# Patient Record
Sex: Female | Born: 1945 | Race: Black or African American | Hispanic: No | Smoking: Current every day smoker
Health system: Southern US, Community
[De-identification: ages and names within clinical notes are randomized; demographics above are authoritative.]

## PROBLEM LIST (undated history)

## (undated) DIAGNOSIS — M1990 Unspecified osteoarthritis, unspecified site: Secondary | ICD-10-CM

## (undated) DIAGNOSIS — I4892 Unspecified atrial flutter: Secondary | ICD-10-CM

## (undated) DIAGNOSIS — E785 Hyperlipidemia, unspecified: Secondary | ICD-10-CM

## (undated) DIAGNOSIS — Z87891 Personal history of nicotine dependence: Secondary | ICD-10-CM

## (undated) DIAGNOSIS — I4891 Unspecified atrial fibrillation: Secondary | ICD-10-CM

## (undated) DIAGNOSIS — E119 Type 2 diabetes mellitus without complications: Secondary | ICD-10-CM

## (undated) DIAGNOSIS — K219 Gastro-esophageal reflux disease without esophagitis: Secondary | ICD-10-CM

## (undated) DIAGNOSIS — I429 Cardiomyopathy, unspecified: Secondary | ICD-10-CM

## (undated) DIAGNOSIS — I509 Heart failure, unspecified: Secondary | ICD-10-CM

## (undated) DIAGNOSIS — E669 Obesity, unspecified: Secondary | ICD-10-CM

## (undated) DIAGNOSIS — I10 Essential (primary) hypertension: Secondary | ICD-10-CM

## (undated) DIAGNOSIS — E538 Deficiency of other specified B group vitamins: Secondary | ICD-10-CM

## (undated) DIAGNOSIS — N6019 Diffuse cystic mastopathy of unspecified breast: Secondary | ICD-10-CM

## (undated) HISTORY — DX: Personal history of nicotine dependence: Z87.891

## (undated) HISTORY — PX: ABDOMINAL HYSTERECTOMY: SHX81

## (undated) HISTORY — PX: BREAST BIOPSY: SHX20

## (undated) HISTORY — PX: CARDIOVERSION: SHX1299

## (undated) HISTORY — DX: Type 2 diabetes mellitus without complications: E11.9

## (undated) HISTORY — DX: Morbid (severe) obesity due to excess calories: E66.01

## (undated) HISTORY — DX: Unspecified atrial flutter: I48.92

## (undated) HISTORY — DX: Cardiomyopathy, unspecified: I42.9

## (undated) HISTORY — PX: CYSTOSCOPY: SUR368

## (undated) HISTORY — DX: Essential (primary) hypertension: I10

---

## 2005-01-11 ENCOUNTER — Emergency Department: Payer: Self-pay | Admitting: Emergency Medicine

## 2007-03-23 ENCOUNTER — Ambulatory Visit: Payer: Self-pay | Admitting: Internal Medicine

## 2008-09-08 ENCOUNTER — Ambulatory Visit: Payer: Self-pay | Admitting: Internal Medicine

## 2008-09-08 ENCOUNTER — Emergency Department: Payer: Self-pay | Admitting: Emergency Medicine

## 2011-01-13 ENCOUNTER — Ambulatory Visit: Payer: Self-pay | Admitting: Obstetrics and Gynecology

## 2014-06-20 ENCOUNTER — Ambulatory Visit: Payer: Self-pay | Admitting: Family Medicine

## 2014-10-09 ENCOUNTER — Ambulatory Visit: Payer: Self-pay | Admitting: Physician Assistant

## 2015-04-21 ENCOUNTER — Inpatient Hospital Stay
Admission: EM | Admit: 2015-04-21 | Discharge: 2015-04-26 | DRG: 308 | Disposition: A | Discharge status: Home / Self Care | Payer: Medicare Other | Attending: Internal Medicine | Admitting: Internal Medicine

## 2015-04-21 ENCOUNTER — Encounter: Payer: Self-pay | Admitting: Emergency Medicine

## 2015-04-21 ENCOUNTER — Emergency Department: Payer: Medicare Other

## 2015-04-21 DIAGNOSIS — Z833 Family history of diabetes mellitus: Secondary | ICD-10-CM

## 2015-04-21 DIAGNOSIS — E119 Type 2 diabetes mellitus without complications: Secondary | ICD-10-CM | POA: Diagnosis present

## 2015-04-21 DIAGNOSIS — I5031 Acute diastolic (congestive) heart failure: Secondary | ICD-10-CM | POA: Diagnosis not present

## 2015-04-21 DIAGNOSIS — Z01818 Encounter for other preprocedural examination: Secondary | ICD-10-CM | POA: Diagnosis not present

## 2015-04-21 DIAGNOSIS — I4892 Unspecified atrial flutter: Secondary | ICD-10-CM

## 2015-04-21 DIAGNOSIS — I429 Cardiomyopathy, unspecified: Secondary | ICD-10-CM | POA: Diagnosis present

## 2015-04-21 DIAGNOSIS — K219 Gastro-esophageal reflux disease without esophagitis: Secondary | ICD-10-CM | POA: Diagnosis present

## 2015-04-21 DIAGNOSIS — I4891 Unspecified atrial fibrillation: Secondary | ICD-10-CM | POA: Diagnosis present

## 2015-04-21 DIAGNOSIS — R0600 Dyspnea, unspecified: Secondary | ICD-10-CM

## 2015-04-21 DIAGNOSIS — Z6841 Body Mass Index (BMI) 40.0 and over, adult: Secondary | ICD-10-CM

## 2015-04-21 DIAGNOSIS — R600 Localized edema: Secondary | ICD-10-CM | POA: Diagnosis present

## 2015-04-21 DIAGNOSIS — I5041 Acute combined systolic (congestive) and diastolic (congestive) heart failure: Secondary | ICD-10-CM | POA: Insufficient documentation

## 2015-04-21 DIAGNOSIS — Z8249 Family history of ischemic heart disease and other diseases of the circulatory system: Secondary | ICD-10-CM

## 2015-04-21 DIAGNOSIS — F17210 Nicotine dependence, cigarettes, uncomplicated: Secondary | ICD-10-CM | POA: Diagnosis present

## 2015-04-21 DIAGNOSIS — E876 Hypokalemia: Secondary | ICD-10-CM | POA: Diagnosis present

## 2015-04-21 DIAGNOSIS — N6019 Diffuse cystic mastopathy of unspecified breast: Secondary | ICD-10-CM | POA: Diagnosis present

## 2015-04-21 DIAGNOSIS — E785 Hyperlipidemia, unspecified: Secondary | ICD-10-CM | POA: Diagnosis present

## 2015-04-21 DIAGNOSIS — R0601 Orthopnea: Secondary | ICD-10-CM | POA: Diagnosis not present

## 2015-04-21 DIAGNOSIS — I10 Essential (primary) hypertension: Secondary | ICD-10-CM | POA: Diagnosis present

## 2015-04-21 DIAGNOSIS — R609 Edema, unspecified: Secondary | ICD-10-CM

## 2015-04-21 DIAGNOSIS — R0602 Shortness of breath: Secondary | ICD-10-CM | POA: Diagnosis not present

## 2015-04-21 DIAGNOSIS — I5043 Acute on chronic combined systolic (congestive) and diastolic (congestive) heart failure: Secondary | ICD-10-CM | POA: Diagnosis not present

## 2015-04-21 DIAGNOSIS — I340 Nonrheumatic mitral (valve) insufficiency: Secondary | ICD-10-CM | POA: Diagnosis not present

## 2015-04-21 DIAGNOSIS — I483 Typical atrial flutter: Secondary | ICD-10-CM | POA: Diagnosis not present

## 2015-04-21 HISTORY — DX: Gastro-esophageal reflux disease without esophagitis: K21.9

## 2015-04-21 HISTORY — DX: Diffuse cystic mastopathy of unspecified breast: N60.19

## 2015-04-21 HISTORY — DX: Obesity, unspecified: E66.9

## 2015-04-21 HISTORY — DX: Hyperlipidemia, unspecified: E78.5

## 2015-04-21 LAB — CBC
HCT: 40.6 % (ref 35.0–47.0)
Hemoglobin: 13.3 g/dL (ref 12.0–16.0)
MCH: 30 pg (ref 26.0–34.0)
MCHC: 32.8 g/dL (ref 32.0–36.0)
MCV: 91.5 fL (ref 80.0–100.0)
PLATELETS: 235 10*3/uL (ref 150–440)
RBC: 4.44 MIL/uL (ref 3.80–5.20)
RDW: 14.9 % — AB (ref 11.5–14.5)
WBC: 9.5 10*3/uL (ref 3.6–11.0)

## 2015-04-21 LAB — TSH: TSH: 0.876 u[IU]/mL (ref 0.350–4.500)

## 2015-04-21 LAB — COMPREHENSIVE METABOLIC PANEL
ALT: 28 U/L (ref 14–54)
ANION GAP: 12 (ref 5–15)
AST: 31 U/L (ref 15–41)
Albumin: 4.2 g/dL (ref 3.5–5.0)
Alkaline Phosphatase: 71 U/L (ref 38–126)
BILIRUBIN TOTAL: 0.5 mg/dL (ref 0.3–1.2)
BUN: 14 mg/dL (ref 6–20)
CHLORIDE: 103 mmol/L (ref 101–111)
CO2: 26 mmol/L (ref 22–32)
Calcium: 9.1 mg/dL (ref 8.9–10.3)
Creatinine, Ser: 1.12 mg/dL — ABNORMAL HIGH (ref 0.44–1.00)
GFR, EST AFRICAN AMERICAN: 58 mL/min — AB (ref >60)
GFR, EST NON AFRICAN AMERICAN: 50 mL/min — AB (ref >60)
Glucose, Bld: 114 mg/dL — ABNORMAL HIGH (ref 65–99)
POTASSIUM: 3.2 mmol/L — AB (ref 3.5–5.1)
Sodium: 141 mmol/L (ref 135–145)
TOTAL PROTEIN: 7.3 g/dL (ref 6.5–8.1)

## 2015-04-21 LAB — PROTIME-INR
INR: 1.02
PROTHROMBIN TIME: 13.6 s (ref 11.4–15.0)

## 2015-04-21 LAB — GLUCOSE, CAPILLARY
GLUCOSE-CAPILLARY: 155 mg/dL — AB (ref 65–99)
Glucose-Capillary: 119 mg/dL — ABNORMAL HIGH (ref 65–99)

## 2015-04-21 LAB — BRAIN NATRIURETIC PEPTIDE: B NATRIURETIC PEPTIDE 5: 96 pg/mL (ref 0.0–100.0)

## 2015-04-21 LAB — TROPONIN I

## 2015-04-21 LAB — APTT: APTT: 27 s (ref 24–36)

## 2015-04-21 MED ORDER — DILTIAZEM HCL 30 MG PO TABS
30.0000 mg | ORAL_TABLET | Freq: Once | ORAL | Status: AC
  Administered 2015-04-21: 30 mg via ORAL
  Filled 2015-04-21: qty 1

## 2015-04-21 MED ORDER — DILTIAZEM HCL 25 MG/5ML IV SOLN
INTRAVENOUS | Status: AC
  Filled 2015-04-21: qty 5

## 2015-04-21 MED ORDER — SODIUM CHLORIDE 0.9 % IV SOLN: 250.0000 mL | INTRAVENOUS | Status: DC | PRN

## 2015-04-21 MED ORDER — DILTIAZEM HCL 100 MG IV SOLR
5.0000 mg/h | Freq: Once | INTRAVENOUS | Status: AC
  Administered 2015-04-21: 5 mg/h via INTRAVENOUS
  Filled 2015-04-21: qty 100

## 2015-04-21 MED ORDER — PRAVASTATIN SODIUM 10 MG PO TABS
10.0000 mg | ORAL_TABLET | Freq: Every day | ORAL | Status: DC
  Administered 2015-04-22 – 2015-04-25 (×4): 10 mg via ORAL
  Filled 2015-04-21 (×4): qty 1

## 2015-04-21 MED ORDER — DILTIAZEM HCL 25 MG/5ML IV SOLN
20.0000 mg | Freq: Once | INTRAVENOUS | Status: AC
  Administered 2015-04-21: 20 mg via INTRAVENOUS

## 2015-04-21 MED ORDER — ACETAMINOPHEN 650 MG RE SUPP: 650.0000 mg | Freq: Four times a day (QID) | RECTAL | Status: DC | PRN

## 2015-04-21 MED ORDER — DILTIAZEM HCL 60 MG PO TABS
60.0000 mg | ORAL_TABLET | Freq: Four times a day (QID) | ORAL | Status: DC
  Administered 2015-04-22 – 2015-04-26 (×18): 60 mg via ORAL
  Filled 2015-04-21 (×18): qty 1

## 2015-04-21 MED ORDER — PANTOPRAZOLE SODIUM 40 MG PO TBEC
40.0000 mg | DELAYED_RELEASE_TABLET | Freq: Every day | ORAL | Status: DC
  Administered 2015-04-22 – 2015-04-26 (×4): 40 mg via ORAL
  Filled 2015-04-21 (×4): qty 1

## 2015-04-21 MED ORDER — GABAPENTIN 300 MG PO CAPS
300.0000 mg | ORAL_CAPSULE | Freq: Every day | ORAL | Status: DC
  Administered 2015-04-22 – 2015-04-25 (×5): 300 mg via ORAL
  Filled 2015-04-21 (×5): qty 1

## 2015-04-21 MED ORDER — GLIMEPIRIDE 2 MG PO TABS
2.0000 mg | ORAL_TABLET | Freq: Every day | ORAL | Status: DC
  Administered 2015-04-22 – 2015-04-25 (×4): 2 mg via ORAL
  Filled 2015-04-21 (×5): qty 1

## 2015-04-21 MED ORDER — HYDROCHLOROTHIAZIDE 25 MG PO TABS
25.0000 mg | ORAL_TABLET | Freq: Every day | ORAL | Status: DC
  Administered 2015-04-22: 25 mg via ORAL
  Filled 2015-04-21: qty 1

## 2015-04-21 MED ORDER — SODIUM CHLORIDE 0.9 % IJ SOLN
3.0000 mL | Freq: Two times a day (BID) | INTRAMUSCULAR | Status: DC
  Administered 2015-04-21 – 2015-04-22 (×2): 3 mL via INTRAVENOUS

## 2015-04-21 MED ORDER — HEPARIN (PORCINE) IN NACL 100-0.45 UNIT/ML-% IJ SOLN: 12.0000 [IU]/kg/h | Freq: Once | INTRAMUSCULAR | Status: DC

## 2015-04-21 MED ORDER — SODIUM CHLORIDE 0.9 % IJ SOLN: 3.0000 mL | INTRAMUSCULAR | Status: DC | PRN

## 2015-04-21 MED ORDER — DILTIAZEM HCL 25 MG/5ML IV SOLN
10.0000 mg | Freq: Once | INTRAVENOUS | Status: AC
  Administered 2015-04-21: 10 mg via INTRAVENOUS
  Filled 2015-04-21: qty 5

## 2015-04-21 MED ORDER — ACETAMINOPHEN 325 MG PO TABS: 650.0000 mg | ORAL_TABLET | Freq: Four times a day (QID) | ORAL | Status: DC | PRN

## 2015-04-21 MED ORDER — LISINOPRIL-HYDROCHLOROTHIAZIDE 20-25 MG PO TABS
1.0000 | ORAL_TABLET | Freq: Every day | ORAL | Status: DC
Start: 2015-04-22 — End: 2015-04-21

## 2015-04-21 MED ORDER — LISINOPRIL 20 MG PO TABS
20.0000 mg | ORAL_TABLET | Freq: Every day | ORAL | Status: DC
  Administered 2015-04-22: 20 mg via ORAL
  Filled 2015-04-21: qty 1

## 2015-04-21 MED ORDER — IOHEXOL 350 MG/ML SOLN
75.0000 mL | Freq: Once | INTRAVENOUS | Status: AC | PRN
  Administered 2015-04-21: 75 mL via INTRAVENOUS

## 2015-04-21 MED ORDER — HEPARIN (PORCINE) IN NACL 100-0.45 UNIT/ML-% IJ SOLN
1400.0000 [IU]/h | INTRAMUSCULAR | Status: DC
  Administered 2015-04-21 – 2015-04-24 (×3): 1400 [IU]/h via INTRAVENOUS
  Filled 2015-04-21 (×7): qty 250

## 2015-04-21 MED ORDER — HEPARIN BOLUS VIA INFUSION
4000.0000 [IU] | Freq: Once | INTRAVENOUS | Status: AC
  Administered 2015-04-21: 4000 [IU] via INTRAVENOUS
  Filled 2015-04-21: qty 4000

## 2015-04-21 NOTE — ED Notes (Signed)
Pt here for SOB that has been ongoing for the past week and a half. Has been steadily worsening.

## 2015-04-21 NOTE — ED Notes (Signed)
CBG 136 

## 2015-04-21 NOTE — ED Provider Notes (Addendum)
[facility] Emergency Department Provider Note     Time seen: ----------------------------------------- 5:55 PM on 04/21/2015 -----------------------------------------    I have reviewed the triage vital signs and the nursing notes.   HISTORY  Chief Complaint Shortness of Breath    HPI Paula Goodman is a 69 y.o. female who presents ER for shortness of breath has been going on for last week and half. Patient states his been steadily worsening.Patient states she's just here to get a breathing treatment and then to go home. She was not aware that her heart was racing, denies any fevers, chills, chest pain, nausea vomiting or diarrhea. Patient does state that her dyspnea is worse with laying flat and exertion. Again she has not had this happen before.   No past medical history on file.  There are no active problems to display for this patient.   No past surgical history on file.  Allergies Review of patient's allergies indicates no known allergies.  Social History Social History  Substance Use Topics  . Smoking status: Current Every Day Smoker  . Smokeless tobacco: None  . Alcohol Use: None    Review of Systems Constitutional: Negative for fever. Eyes: Negative for visual changes. ENT: Negative for sore throat. Cardiovascular: Negative for chest pain. Respiratory: Positive for shortness of breath Gastrointestinal: Negative for abdominal pain, vomiting and diarrhea. Genitourinary: Negative for dysuria. Musculoskeletal: Negative for back pain. Skin: Negative for rash. Neurological: Negative for headaches, focal weakness or numbness.  10-point ROS otherwise negative.  ____________________________________________   PHYSICAL EXAM:  VITAL SIGNS: ED Triage Vitals  Enc Vitals Group     BP 04/21/15 1750 98/58 mmHg     Pulse Rate 04/21/15 1747 153     Resp 04/21/15 1747 20     Temp 04/21/15 1747 97.7 F (36.5 C)     Temp src --       SpO2 --      Weight --      Height --      Head Cir --      Peak Flow --      Pain Score --      Pain Loc --      Pain Edu? --      Excl. in [place]? --     Constitutional: Alert and oriented. Mild distress Eyes: Conjunctivae are normal. PERRL. Normal extraocular movements. ENT   Head: Normocephalic and atraumatic.   Nose: No congestion/rhinnorhea.   Mouth/Throat: Mucous membranes are moist.   Neck: No stridor. Cardiovascular: Rapid rate, regular rhythm. Normal and symmetric distal pulses are present in all extremities. No murmurs, rubs, or gallops. Respiratory: Normal respiratory effort with mild tachypnea. Breath sounds are clear and equal bilaterally. No wheezes/rales/rhonchi. Gastrointestinal: Soft and nontender. No distention. No abdominal bruits.  Musculoskeletal: Nontender with normal range of motion in all extremities. No joint effusions.  No lower extremity tenderness, mild edema lower extremities around the ankles Neurologic:  Normal speech and language. No gross focal neurologic deficits are appreciated. Speech is normal. No gait instability. Skin:  Skin is warm, dry and intact. No rash noted. Psychiatric: Mood and affect are normal. Speech and behavior are normal. Patient exhibits appropriate insight and judgment. ____________________________________________  EKG: Interpreted by me. Atrial flutter with a 2-1 conduction, rate is 154 bpm, normal QRS with likely septal infarct age indeterminate, normal axis.  ____________________________________________  ED COURSE:  Pertinent labs & imaging results that were available during my care of the patient were reviewed by  me and considered in my medical decision making (see chart for details). Patient with atrial flutter, new onset. Unclear etiology of her symptoms, she'll receive IV Cardizem. Likely new onset congestive heart failure. ____________________________________________    LABS (pertinent  positives/negatives)  Labs Reviewed  CBC - Abnormal; Notable for the following:    RDW 14.9 (*)    All other components within normal limits  COMPREHENSIVE METABOLIC PANEL - Abnormal; Notable for the following:    Potassium 3.2 (*)    Glucose, Bld 114 (*)    Creatinine, Ser 1.12 (*)    GFR calc non Af Amer 50 (*)    GFR calc Af Amer 58 (*)    All other components within normal limits  TROPONIN I  APTT  PROTIME-INR  BRAIN NATRIURETIC PEPTIDE  TSH    RADIOLOGY Images were viewed by me  Chest x-ray Does not reveal any acute process  IMPRESSION: No acute cardiopulmonary disease and no evidence of pulmonary embolism.  5-6 mm nodule over the lingula. Recommend followup chest CT 6 months. This recommendation follows the consensus statement: Guidelines for Management of Small Pulmonary Nodules Detected on CT Scans: A Statement from the [place] as published in Radiology 2005; 237:395-400. Online at: [email or web address].[email or web address]  1.3 cm precarinal lymph node likely reactive.  Mild cardiomegaly. 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 Recommend annual imaging followup by CTA or MRA. This recommendation follows 2010 ACCF/AHA/AATS/ACR/ASA/SCA/SCAI/SIR/STS/SVM Guidelines for the Diagnosis and Management of Patients with Thoracic Aortic Disease. Circulation.2010; 121: F027-X412.  ____________________________________________  CRITICAL CARE Performed by: Earleen Newport   Total critical care time: 30 minutes  Critical care time was exclusive of separately billable procedures and treating other patients.  Critical care was necessary to treat or prevent imminent or life-threatening deterioration.  Critical care was time spent personally by me on the following activities: development of treatment plan with patient and/or surrogate as well as nursing, discussions with consultants, evaluation of  patient's response to treatment, examination of patient, obtaining history from patient or surrogate, ordering and performing treatments and interventions, ordering and review of laboratory studies, ordering and review of radiographic studies, pulse oximetry and re-evaluation of patient's condition.   FINAL ASSESSMENT AND PLAN  Dyspnea, atrial flutter  Plan: Patient with labs and imaging as dictated above. Patient received several doses of IV Cardizem, or Cardizem for rate control. Patient remains in atrial flutter fib/flutter. Patient will be placed on a Cardizem drip as well as a heparin drip. At this point etiology is unclear.   Earleen Newport, MD   Earleen Newport, MD 04/21/15 1906  Earleen Newport, MD 04/21/15 2031

## 2015-04-21 NOTE — H&P (Signed)
Paula Goodman is an 70 y.o. female.   Chief Complaint: Shortness of breath HPI: Patient presents with a 3 week history of shortness of breath. Mild cough. No chest pain. Shortness of breath worse today. C/O leg swelling with right greater than left. Sitting up seems to make it worse.  No past medical history on file.  HTN GERD DM Hyperlipedemia   No past surgical history on file.  Hysterectomy  No family history on file.  Positive for DM  Social History:  reports that she has been smoking.  She does not have any smokeless tobacco history on file. Her alcohol and drug histories are not on file.  Allergies: No Known Allergies   (Not in a hospital admission)  Results for orders placed or performed during the hospital encounter of 04/21/15 (from the past 48 hour(s))  Brain natriuretic peptide     Status: None   Collection Time: 04/21/15  6:02 PM  Result Value Ref Range   B Natriuretic Peptide 96.0 0.0 - 100.0 pg/mL  CBC     Status: Abnormal   Collection Time: 04/21/15  6:04 PM  Result Value Ref Range   WBC 9.5 3.6 - 11.0 K/uL   RBC 4.44 3.80 - 5.20 MIL/uL   Hemoglobin 13.3 12.0 - 16.0 g/dL   HCT 40.6 35.0 - 47.0 %   MCV 91.5 80.0 - 100.0 fL   MCH 30.0 26.0 - 34.0 pg   MCHC 32.8 32.0 - 36.0 g/dL   RDW 14.9 (H) 11.5 - 14.5 %   Platelets 235 150 - 440 K/uL  Troponin I     Status: None   Collection Time: 04/21/15  6:04 PM  Result Value Ref Range   Troponin I <0.03 <0.031 ng/mL    Comment:        NO INDICATION OF MYOCARDIAL INJURY.   APTT     Status: None   Collection Time: 04/21/15  6:04 PM  Result Value Ref Range   aPTT 27 24 - 36 seconds  Protime-INR     Status: None   Collection Time: 04/21/15  6:04 PM  Result Value Ref Range   Prothrombin Time 13.6 11.4 - 15.0 seconds   INR 1.02   Comprehensive metabolic panel     Status: Abnormal   Collection Time: 04/21/15  6:04 PM  Result Value Ref Range   Sodium 141 135 - 145 mmol/L   Potassium 3.2 (L) 3.5 - 5.1  mmol/L   Chloride 103 101 - 111 mmol/L   CO2 26 22 - 32 mmol/L   Glucose, Bld 114 (H) 65 - 99 mg/dL   BUN 14 6 - 20 mg/dL   Creatinine, Ser 1.12 (H) 0.44 - 1.00 mg/dL   Calcium 9.1 8.9 - 10.3 mg/dL   Total Protein 7.3 6.5 - 8.1 g/dL   Albumin 4.2 3.5 - 5.0 g/dL   AST 31 15 - 41 U/L   ALT 28 14 - 54 U/L   Alkaline Phosphatase 71 38 - 126 U/L   Total Bilirubin 0.5 0.3 - 1.2 mg/dL   GFR calc non Af Amer 50 (L) >60 mL/min   GFR calc Af Amer 58 (L) >60 mL/min    Comment: (NOTE) The eGFR has been calculated using the CKD EPI equation. This calculation has not been validated in all clinical situations. eGFR's persistently <60 mL/min signify possible Chronic Kidney Disease.    Anion gap 12 5 - 15  TSH     Status: None   Collection  Time: 04/21/15  6:04 PM  Result Value Ref Range   TSH 0.876 0.350 - 4.500 uIU/mL   Dg Chest Port 1 View  04/21/2015   CLINICAL DATA:  Worsening shortness of breath since yesterday.  EXAM: PORTABLE CHEST - 1 VIEW  COMPARISON:  09/08/2008  FINDINGS: Lungs are adequately inflated with minimal prominence of the perihilar markings. No evidence of effusion or focal consolidation. Cardiomediastinal silhouette and remainder of the exam is within normal.  IMPRESSION: Evidence of mild vascular congestion.   Electronically Signed   By: Marin Olp M.D.   On: 04/21/2015 18:14    Review of Systems  Constitutional: Negative for fever and chills.  HENT: Negative for hearing loss.   Eyes: Negative for blurred vision.  Respiratory: Positive for shortness of breath.   Cardiovascular: Positive for palpitations. Negative for chest pain.  Gastrointestinal: Negative for nausea, vomiting and diarrhea.  Genitourinary: Negative for dysuria.  Musculoskeletal: Negative for joint pain.  Skin: Negative for rash.  Neurological: Negative for dizziness, sensory change and weakness.    Blood pressure 103/87, pulse 150, temperature 97.7 F (36.5 C), resp. rate 22, SpO2 97 %. Physical  Exam  Constitutional: She is oriented to person, place, and time. She appears well-developed and well-nourished.  Mild resp distress.  HENT:  Head: Normocephalic.  Mouth/Throat: Oropharynx is clear and moist. No oropharyngeal exudate.  Eyes: EOM are normal. Pupils are equal, round, and reactive to light. No scleral icterus.  Neck: Neck supple. No JVD present. No tracheal deviation present. No thyromegaly present.  Cardiovascular:  No murmur heard. Irregular heart beat  Respiratory: She has no wheezes. She exhibits no tenderness.  Clear to ascultation. Mild use of accessary muscles  GI: Soft. Bowel sounds are normal. She exhibits no mass. There is no tenderness.  No organomegaly  Musculoskeletal:  Right leg more edematous than left.  No calf tenderness.  Lymphadenopathy:    She has no cervical adenopathy.  Neurological: She is alert and oriented to person, place, and time. No cranial nerve deficit.  Skin: Skin is warm and dry. No rash noted. No erythema.     Assessment/Plan 1. Atrial Fibrilation with RVR: Patient HR in 140's. Given 2 IV doses of cardizem. Rate has slowed to about 100. Will try to convert to PO cardizem. Will rule out PE as cause with CT. Echo also ordered. Have started anticoagulation.  2. Shortness of Breath: may be due to atrial fib. Ruling out PE as cause. Patient is a smoker and may be developing COPD. No wheezing on exam so will not start nebs at this time.  3. HTN: Tolerating cardizem. Continue other home meds.  4. DM: Contorlled with home meds.  Reviewed past medical records. Discussed case with Dr. Jimmye Norman.  Time spent = 33min    Baxter Hire 04/21/2015, 7:37 PM

## 2015-04-21 NOTE — Progress Notes (Addendum)
ANTICOAGULATION CONSULT NOTE - Initial Consult  Pharmacy Consult for Heparin Indication: atrial fibrillation  No Known Allergies  Patient Measurements: Height: 5\' 7"  (170.2 cm) Weight: 287 lb 6.4 oz (130.364 kg) IBW/kg (Calculated) : 61.6 Heparin Dosing Weight: 93 kg  Vital Signs: Temp: 97.7 F (36.5 C) (08/28 1747) BP: 103/87 mmHg (08/28 1900) Pulse Rate: 150 (08/28 1900)  Labs:  Recent Labs  04/21/15 1804  HGB 13.3  HCT 40.6  PLT 235  APTT 27  LABPROT 13.6  INR 1.02  CREATININE 1.12*  TROPONINI <0.03    Estimated Creatinine Clearance: 68.6 mL/min (by C-G formula based on Cr of 1.12).   Medical History: Past Medical History  Diagnosis Date  . Diabetes mellitus without complication   . Hypertension     Medications:   (Not in a hospital admission)  Assessment: Afib,  No prior anticoagulation noted CrCl = 68.6 ml/mn  Goal of Therapy:  Heparin level 0.3-0.7 units/ml Monitor platelets by anticoagulation protocol: Yes   Plan:  Heparin 4000 units IV X 1 ordered. Heparin gtt ordered to start @ 1400 units/hr. Will draw 1st HL 6 hrs after start of drip on 8/29 @ 2:00.  Paula Goodman D 04/21/2015,8:11 PM

## 2015-04-22 ENCOUNTER — Inpatient Hospital Stay (HOSPITAL_COMMUNITY)
Admit: 2015-04-22 | Discharge: 2015-04-22 | Disposition: A | Discharge status: Home / Self Care | Payer: Medicare Other | Attending: Internal Medicine | Admitting: Internal Medicine

## 2015-04-22 DIAGNOSIS — I340 Nonrheumatic mitral (valve) insufficiency: Secondary | ICD-10-CM

## 2015-04-22 LAB — CBC
HCT: 37.6 % (ref 35.0–47.0)
Hemoglobin: 12.4 g/dL (ref 12.0–16.0)
MCH: 30.1 pg (ref 26.0–34.0)
MCHC: 32.9 g/dL (ref 32.0–36.0)
MCV: 91.4 fL (ref 80.0–100.0)
PLATELETS: 200 10*3/uL (ref 150–440)
RBC: 4.11 MIL/uL (ref 3.80–5.20)
RDW: 14.9 % — AB (ref 11.5–14.5)
WBC: 7.7 10*3/uL (ref 3.6–11.0)

## 2015-04-22 LAB — BASIC METABOLIC PANEL
ANION GAP: 7 (ref 5–15)
Anion gap: 8 (ref 5–15)
BUN: 11 mg/dL (ref 6–20)
BUN: 10 mg/dL (ref 6–20)
CALCIUM: 8.8 mg/dL — AB (ref 8.9–10.3)
CHLORIDE: 104 mmol/L (ref 101–111)
CO2: 29 mmol/L (ref 22–32)
CO2: 28 mmol/L (ref 22–32)
Calcium: 9 mg/dL (ref 8.9–10.3)
Chloride: 106 mmol/L (ref 101–111)
Creatinine, Ser: 0.93 mg/dL (ref 0.44–1.00)
Creatinine, Ser: 0.9 mg/dL (ref 0.44–1.00)
GFR calc Af Amer: >60 mL/min (ref >60)
GFR calc Af Amer: >60 mL/min (ref >60)
GFR calc non Af Amer: >60 mL/min (ref >60)
GLUCOSE: 114 mg/dL — AB (ref 65–99)
GLUCOSE: 172 mg/dL — AB (ref 65–99)
POTASSIUM: 3.3 mmol/L — AB (ref 3.5–5.1)
Potassium: 3 mmol/L — ABNORMAL LOW (ref 3.5–5.1)
SODIUM: 142 mmol/L (ref 135–145)
Sodium: 140 mmol/L (ref 135–145)

## 2015-04-22 LAB — HEPARIN LEVEL (UNFRACTIONATED)
HEPARIN UNFRACTIONATED: 0.42 [IU]/mL (ref 0.30–0.70)
Heparin Unfractionated: 0.42 IU/mL (ref 0.30–0.70)

## 2015-04-22 LAB — GLUCOSE, CAPILLARY
GLUCOSE-CAPILLARY: 108 mg/dL — AB (ref 65–99)
Glucose-Capillary: 188 mg/dL — ABNORMAL HIGH (ref 65–99)
Glucose-Capillary: 109 mg/dL — ABNORMAL HIGH (ref 65–99)
Glucose-Capillary: 151 mg/dL — ABNORMAL HIGH (ref 65–99)

## 2015-04-22 LAB — TSH: TSH: 0.807 u[IU]/mL (ref 0.350–4.500)

## 2015-04-22 LAB — MRSA PCR SCREENING: MRSA by PCR: NEGATIVE

## 2015-04-22 LAB — TROPONIN I: Troponin I: <0.03 ng/mL (ref <0.031)

## 2015-04-22 MED ORDER — DIGOXIN 250 MCG PO TABS
0.2500 mg | ORAL_TABLET | Freq: Every day | ORAL | Status: DC
  Administered 2015-04-22 – 2015-04-26 (×5): 0.25 mg via ORAL
  Filled 2015-04-22 (×3): qty 1
  Filled 2015-04-22: qty 2
  Filled 2015-04-22: qty 1

## 2015-04-22 MED ORDER — POTASSIUM CHLORIDE CRYS ER 20 MEQ PO TBCR
40.0000 meq | EXTENDED_RELEASE_TABLET | Freq: Two times a day (BID) | ORAL | Status: DC
  Administered 2015-04-22 – 2015-04-26 (×8): 40 meq via ORAL
  Filled 2015-04-22 (×8): qty 2

## 2015-04-22 MED ORDER — POTASSIUM CHLORIDE 10 MEQ/100ML IV SOLN
10.0000 meq | INTRAVENOUS | Status: AC
  Administered 2015-04-22 (×4): 10 meq via INTRAVENOUS
  Filled 2015-04-22 (×4): qty 100

## 2015-04-22 NOTE — Progress Notes (Addendum)
Patient received from CCU in no distress, medications given, heparin drip infusing without difficulty, no pain, Aflutter 70's on tele, VSS, call bell in reach, family at bedside, will continue to assess, patient ambulated around nurses station without difficulty

## 2015-04-22 NOTE — Progress Notes (Signed)
ANTICOAGULATION CONSULT NOTE - Initial Consult  Pharmacy Consult for Heparin Indication: atrial fibrillation  No Known Allergies  Patient Measurements: Height: 5\' 7"  (170.2 cm) Weight: 287 lb 6.4 oz (130.364 kg) IBW/kg (Calculated) : 61.6 Heparin Dosing Weight: 93 kg  Vital Signs: Temp: 98 F (36.7 C) (08/29 0200) Temp Source: Oral (08/29 0200) BP: 99/64 mmHg (08/29 0300) Pulse Rate: 73 (08/29 0300)  Labs:  Recent Labs  04/21/15 1804 04/21/15 2352 04/22/15 0207  HGB 13.3  --   --   HCT 40.6  --   --   PLT 235  --   --   APTT 27  --   --   LABPROT 13.6  --   --   INR 1.02  --   --   HEPARINUNFRC  --   --  0.42  CREATININE 1.12*  --   --   TROPONINI <0.03 <0.03  --     Estimated Creatinine Clearance: 68.6 mL/min (by C-G formula based on Cr of 1.12).   Medical History: Past Medical History  Diagnosis Date  . Diabetes mellitus without complication   . Hypertension     Medications:  Prescriptions prior to admission  Medication Sig Dispense Refill Last Dose  . albuterol (PROVENTIL HFA;VENTOLIN HFA) 108 (90 BASE) MCG/ACT inhaler Inhale 2 puffs into the lungs every 6 (six) hours as needed for wheezing or shortness of breath.   04/18/2015  . colchicine 0.6 MG tablet Take 0.6 mg by mouth daily as needed. For gout flare up.   Past Month at Unknown time  . gabapentin (NEURONTIN) 300 MG capsule Take 300 mg by mouth at bedtime.   04/20/2015 at Unknown time  . glimepiride (AMARYL) 2 MG tablet Take 2 mg by mouth daily.  1 04/21/2015 at Unknown time  . glucose (SUNMARK GLUCOSE) 4 GM chewable tablet Chew 8 g by mouth daily as needed. For low blood sugar.   prn  . ketoconazole (NIZORAL) 2 % cream Apply 1 application topically 2 (two) times daily as needed. For rash   04/18/2015  . lisinopril-hydrochlorothiazide (PRINZIDE,ZESTORETIC) 20-25 MG per tablet Take 1 tablet by mouth daily.  1 04/21/2015 at Unknown time  . lovastatin (MEVACOR) 40 MG tablet Take 40 mg by mouth every evening.  With dinner.  1 04/20/2015 at Unknown time  . metFORMIN (GLUCOPHAGE) 1000 MG tablet Take 1,000 mg by mouth 2 (two) times daily with a meal.  4 04/21/2015 at Unknown time  . omeprazole (PRILOSEC) 20 MG capsule Take 20 mg by mouth daily.  1 04/21/2015 at Unknown time    Assessment: Afib,  No prior anticoagulation noted CrCl = 68.6 ml/mn  Goal of Therapy:  Heparin level 0.3-0.7 units/ml Monitor platelets by anticoagulation protocol: Yes   Plan:  Heparin 4000 units IV X 1 ordered. Heparin gtt ordered to start @ 1400 units/hr. Will draw 1st HL 6 hrs after start of drip on 8/29 @ 2:00.  8/29 02:00 anti-xa 0.42. Recheck in 6 hours to confirm with CBC.  Darrin Koman S 04/22/2015,3:22 AM

## 2015-04-22 NOTE — Care Management Note (Signed)
Case Management Note  Patient Details  Name: Paula Goodman MRN: [id number] Date of Birth: Jun 07, 1946  Subjective/Objective:   New onset Afib with RVR.                  Action/Plan: Met with patient to discuss discharge planning. Patient lives at home and her daughter lives with her. She is independent, active and drives. Current with PCP, Dr. Inda Merlin at [facility], [facility]. She uses no assistive devices and requires no home O2. Patient is aware that they may place her on new anticoagulants. Will offer coupon if available.  No needs anticipated at this time  Expected Discharge Date:                  Expected Discharge Plan:  Home/Self Care  In-House Referral:     Discharge planning Services     Post Acute Care Choice:    Choice offered to:     DME Arranged:    DME Agency:     HH Arranged:    [place] Agency:     Status of Service:  In process, will continue to follow  Medicare Important Message Given:    Date Medicare IM Given:    Medicare IM give by:    Date Additional Medicare IM Given:    Additional Medicare Important Message give by:     If discussed at [place] of Stay Meetings, dates discussed:    Additional Comments:  Jolly Mango, RN 04/22/2015, 2:25 PM

## 2015-04-22 NOTE — Progress Notes (Signed)
Patient reports she does not feel well and thinks her sugar is elevated because she is sweating. CBG is 188. Patient received amaryl this morning. Will monitor.

## 2015-04-22 NOTE — Progress Notes (Signed)
[facility]                                                                                                                                                                                            Patient Demographics   Paula Goodman, is a 69 y.o. female, DOB - 12/26/1945, KNL:[id number]  Admit date - 04/21/2015   Admitting Physician Baxter Hire, MD  Outpatient Primary MD for the patient is Sherrin Daisy, MD   LOS - 1  Subjective: His breathing is improved, was taken off Cardizem drip heart rate increases with any activity denies any chest pain     Review of Systems:   CONSTITUTIONAL: No documented fever. No fatigue, weakness. No weight gain, no weight loss.  EYES: No blurry or double vision.  ENT: No tinnitus. No postnasal drip. No redness of the oropharynx.  RESPIRATORY: No cough, no wheeze, no hemoptysis. Positive dyspnea.  CARDIOVASCULAR: No chest pain. No orthopnea. Positive palpitations. No syncope.  GASTROINTESTINAL: No nausea, no vomiting or diarrhea. No abdominal pain. No melena or hematochezia.  GENITOURINARY: No dysuria or hematuria.  ENDOCRINE: No polyuria or nocturia. No heat or cold intolerance.  HEMATOLOGY: No anemia. No bruising. No bleeding.  INTEGUMENTARY: No rashes. No lesions.  MUSCULOSKELETAL: No arthritis. No swelling. No gout.  NEUROLOGIC: No numbness, tingling, or ataxia. No seizure-type activity.  PSYCHIATRIC: No anxiety. No insomnia. No ADD.    Vitals:   Filed Vitals:   04/22/15 0700 04/22/15 0800 04/22/15 0900 04/22/15 1100  BP: 109/65 114/76 124/94   Pulse: 72 77 70   Temp: 98 F (36.7 C)   97.6 F (36.4 C)  TempSrc: Oral   Axillary  Resp: 17 26 25    Height:      Weight:      SpO2: 96% 99% 95%     Wt Readings from Last 3 Encounters:  04/21/15 130.364 kg (287 lb 6.4 oz)     Intake/Output Summary (Last 24 hours) at 04/22/15 1237 Last data filed at 04/22/15 1100  Gross per 24 hour   Intake 446.12 ml  Output    350 ml  Net  96.12 ml    Physical Exam:   GENERAL: Pleasant-appearing in no apparent distress.  HEAD, EYES, EARS, NOSE AND THROAT: Atraumatic, normocephalic. Extraocular muscles are intact. Pupils equal and reactive to light. Sclerae anicteric. No conjunctival injection. No oro-pharyngeal erythema.  NECK: Supple. There is no jugular venous distention. No bruits, no lymphadenopathy, no thyromegaly.  HEART: Irregularly irregular rate and rhythm,. No murmurs, no rubs, no clicks.  LUNGS: Clear to auscultation bilaterally. No rales  or rhonchi. No wheezes.  ABDOMEN: Soft, flat, nontender, nondistended. Has good bowel sounds. No hepatosplenomegaly appreciated.  EXTREMITIES: No evidence of any cyanosis, clubbing, or peripheral edema.  +2 pedal and radial pulses bilaterally.  NEUROLOGIC: The patient is alert, awake, and oriented x3 with no focal motor or sensory deficits appreciated bilaterally.  SKIN: Moist and warm with no rashes appreciated.  Psych: Not anxious, depressed LN: No inguinal LN enlargement    Antibiotics   Anti-infectives    None      Medications   Scheduled Meds: . digoxin  0.25 mg Oral Daily  . diltiazem  60 mg Oral 4 times per day  . gabapentin  300 mg Oral QHS  . glimepiride  2 mg Oral Daily  . lisinopril  20 mg Oral Daily   And  . hydrochlorothiazide  25 mg Oral Daily  . pantoprazole  40 mg Oral Daily  . potassium chloride  40 mEq Oral BID  . pravastatin  10 mg Oral q1800  . sodium chloride  3 mL Intravenous Q12H   Continuous Infusions: . heparin 1,400 Units/hr (04/22/15 1140)   PRN Meds:.sodium chloride, acetaminophen **OR** acetaminophen, sodium chloride   Data Review:   Micro Results Recent Results (from the past 240 hour(s))  MRSA PCR Screening     Status: None   Collection Time: 04/21/15 11:10 PM  Result Value Ref Range Status   MRSA by PCR NEGATIVE NEGATIVE Final    Comment:        The GeneXpert MRSA Assay  (FDA approved for NASAL specimens only), is one component of a comprehensive MRSA colonization surveillance program. It is not intended to diagnose MRSA infection nor to guide or monitor treatment for MRSA infections.     Radiology Reports Ct Angio Chest Pe W/cm &/or Wo Cm  04/21/2015   CLINICAL DATA:  Progressive dyspnea 1-2 weeks worse today with tachycardia. Smoker.  EXAM: CT ANGIOGRAPHY CHEST WITH CONTRAST  TECHNIQUE: Multidetector CT imaging of the chest was performed using the standard protocol during bolus administration of intravenous contrast. Multiplanar CT image reconstructions and MIPs were obtained to evaluate the vascular anatomy.  CONTRAST:  39mL OMNIPAQUE IOHEXOL 350 MG/ML SOLN  COMPARISON:  Chest x-ray today.  FINDINGS: Lungs are adequately inflated without focal consolidation or effusion. There is minimal linear atelectasis/scarring over the lingula. There is a 5-6 mm nodular density over the lingula. Airways are within normal.  There is mild cardiomegaly. There is no evidence of pulmonary embolism. There is no significant hilar adenopathy. There is a 1.3 cm precarinal lymph node likely reactive. Ascending thoracic aorta measures 3.7 cm in AP diameter. No axillary adenopathy. Remaining mediastinal structures are unremarkable.  Limited images through the upper abdomen are within normal. There are mild degenerate changes of the spine.  Review of the MIP images confirms the above findings.  IMPRESSION: No acute cardiopulmonary disease and no evidence of pulmonary embolism.  5-6 mm nodule over the lingula. Recommend followup chest CT 6 months. This recommendation follows the consensus statement: Guidelines for Management of Small Pulmonary Nodules Detected on CT Scans: A Statement from the [employer] as published in Radiology 2005; 237:395-400. Online at: https://www.arnold.com/.  1.3 cm precarinal lymph node likely reactive.  Mild cardiomegaly. Mild  ectasia of the ascending thoracic aorta measuring 3.7 cm in AP diameter. Recommend annual imaging followup by CTA or MRA. This recommendation follows 2010 ACCF/AHA/AATS/ACR/ASA/SCA/SCAI/SIR/STS/SVM Guidelines for the Diagnosis and Management of Patients with Thoracic Aortic Disease. Circulation.2010; 121: M578-I696.   Electronically  Signed   By: Marin Olp M.D.   On: 04/21/2015 20:23   Dg Chest Port 1 View  04/21/2015   CLINICAL DATA:  Worsening shortness of breath since yesterday.  EXAM: PORTABLE CHEST - 1 VIEW  COMPARISON:  09/08/2008  FINDINGS: Lungs are adequately inflated with minimal prominence of the perihilar markings. No evidence of effusion or focal consolidation. Cardiomediastinal silhouette and remainder of the exam is within normal.  IMPRESSION: Evidence of mild vascular congestion.   Electronically Signed   By: Marin Olp M.D.   On: 04/21/2015 18:14     CBC  Recent Labs Lab 04/21/15 1804 04/22/15 0813  WBC 9.5 7.7  HGB 13.3 12.4  HCT 40.6 37.6  PLT 235 200  MCV 91.5 91.4  MCH 30.0 30.1  MCHC 32.8 32.9  RDW 14.9* 14.9*    Chemistries   Recent Labs Lab 04/21/15 1804 04/22/15 0544  NA 141 142  K 3.2* 3.0*  CL 103 106  CO2 26 29  GLUCOSE 114* 114*  BUN 14 11  CREATININE 1.12* 0.93  CALCIUM 9.1 8.8*  AST 31  --   ALT 28  --   ALKPHOS 71  --   BILITOT 0.5  --    ------------------------------------------------------------------------------------------------------------------ estimated creatinine clearance is 82.6 mL/min (by C-G formula based on Cr of 0.93). ------------------------------------------------------------------------------------------------------------------ No results for input(s): HGBA1C in the last 72 hours. ------------------------------------------------------------------------------------------------------------------ No results for input(s): CHOL, HDL, LDLCALC, TRIG, CHOLHDL, LDLDIRECT in the last 72  hours. ------------------------------------------------------------------------------------------------------------------  Recent Labs  04/21/15 1804  TSH 0.876   ------------------------------------------------------------------------------------------------------------------ No results for input(s): VITAMINB12, FOLATE, FERRITIN, TIBC, IRON, RETICCTPCT in the last 72 hours.  Coagulation profile  Recent Labs Lab 04/21/15 1804  INR 1.02    No results for input(s): DDIMER in the last 72 hours.  Cardiac Enzymes  Recent Labs Lab 04/21/15 2352 04/22/15 0544 04/22/15 1103  TROPONINI <0.03 <0.03 <0.03   ------------------------------------------------------------------------------------------------------------------ Invalid input(s): POCBNP    Assessment & Plan   1.  Atrial Fibrilation with RVR: Continue oral Cardizem. Heart rate is still poorly controlled I will start on some digoxin due to low blood pressure cardiology evaluation pending. Echocardiogram of the heart patients chads score is high and will require anticoagulation currently on heparin drip.   2. Shortness of Breath: may be due to atrial fibCT scan per PE PE negative for any pulmonary pathology  3. HTNContinue Cardizem and discontinue other blood pressure meds due to blood pressure being low   4. DM: Contorlled with home meds.Sliding scale insulin        Code Status Orders        Start     Ordered   04/21/15 2313  Full code   Continuous     04/21/15 2312           Consults cardiology   DVT Prophylaxis  heparin   Lab Results  Component Value Date   PLT 200 04/22/2015     Time Spent in minutes  45 minutes     Dustin Flock M.D on 04/22/2015 at 12:37 PM  Between 7am to 6pm - Pager - [phone]  After 6pm go to [email or web address] - password [email or web address] [facility]   Office  [phone]

## 2015-04-22 NOTE — Progress Notes (Signed)
ANTICOAGULATION CONSULT NOTE - Initial Consult  Pharmacy Consult for Heparin Indication: atrial fibrillation  No Known Allergies  Patient Measurements: Height: 5\' 7"  (170.2 cm) Weight: 287 lb 6.4 oz (130.364 kg) IBW/kg (Calculated) : 61.6 Heparin Dosing Weight: 93 kg  Vital Signs: Temp: 97.6 F (36.4 C) (08/29 1100) Temp Source: Axillary (08/29 1100) BP: 116/85 mmHg (08/29 1300) Pulse Rate: 52 (08/29 1300)  Labs:  Recent Labs  04/21/15 1804 04/21/15 2352 04/22/15 0207 04/22/15 0544 04/22/15 0813 04/22/15 1103  HGB 13.3  --   --   --  12.4  --   HCT 40.6  --   --   --  37.6  --   PLT 235  --   --   --  200  --   APTT 27  --   --   --   --   --   LABPROT 13.6  --   --   --   --   --   INR 1.02  --   --   --   --   --   HEPARINUNFRC  --   --  0.42  --  0.42  --   CREATININE 1.12*  --   --  0.93  --   --   TROPONINI <0.03 <0.03  --  <0.03  --  <0.03    Estimated Creatinine Clearance: 82.6 mL/min (by C-G formula based on Cr of 0.93).   Medical History: Past Medical History  Diagnosis Date  . Diabetes mellitus without complication   . Hypertension     Medications:  Prescriptions prior to admission  Medication Sig Dispense Refill Last Dose  . albuterol (PROVENTIL HFA;VENTOLIN HFA) 108 (90 BASE) MCG/ACT inhaler Inhale 2 puffs into the lungs every 6 (six) hours as needed for wheezing or shortness of breath.   04/18/2015  . colchicine 0.6 MG tablet Take 0.6 mg by mouth daily as needed. For gout flare up.   Past Month at Unknown time  . gabapentin (NEURONTIN) 300 MG capsule Take 300 mg by mouth at bedtime.   04/20/2015 at Unknown time  . glimepiride (AMARYL) 2 MG tablet Take 2 mg by mouth daily.  1 04/21/2015 at Unknown time  . glucose (SUNMARK GLUCOSE) 4 GM chewable tablet Chew 8 g by mouth daily as needed. For low blood sugar.   prn  . ketoconazole (NIZORAL) 2 % cream Apply 1 application topically 2 (two) times daily as needed. For rash   04/18/2015  .  lisinopril-hydrochlorothiazide (PRINZIDE,ZESTORETIC) 20-25 MG per tablet Take 1 tablet by mouth daily.  1 04/21/2015 at Unknown time  . lovastatin (MEVACOR) 40 MG tablet Take 40 mg by mouth every evening. With dinner.  1 04/20/2015 at Unknown time  . metFORMIN (GLUCOPHAGE) 1000 MG tablet Take 1,000 mg by mouth 2 (two) times daily with a meal.  4 04/21/2015 at Unknown time  . omeprazole (PRILOSEC) 20 MG capsule Take 20 mg by mouth daily.  1 04/21/2015 at Unknown time    Assessment: 69 y/o F with afib on heparin drip and cardizem drip.   Goal of Therapy:  Heparin level 0.3-0.7 units/ml Monitor platelets by anticoagulation protocol: Yes   Plan:  Heparin level is at goal so will continue heparin drip at 1400 units and f/u AM labs.   Ulice Dash D 04/22/2015,1:17 PM

## 2015-04-22 NOTE — Progress Notes (Addendum)
Pt a&o. On room air O2 WNL, pt does become SOB with exertion.  Cardizem drip off at 0300, HR controlled with PO cardizem although pt remains in a-fib. No c/o pain.  MD informed of K of 3.0 this am.

## 2015-04-22 NOTE — Progress Notes (Signed)
*  PRELIMINARY RESULTS* Echocardiogram 2D Echocardiogram has been performed.  Paula Goodman 04/22/2015, 3:19 PM

## 2015-04-23 ENCOUNTER — Encounter: Payer: Self-pay | Admitting: Physician Assistant

## 2015-04-23 ENCOUNTER — Inpatient Hospital Stay: Payer: Medicare Other

## 2015-04-23 DIAGNOSIS — I5031 Acute diastolic (congestive) heart failure: Secondary | ICD-10-CM

## 2015-04-23 DIAGNOSIS — R0602 Shortness of breath: Secondary | ICD-10-CM

## 2015-04-23 DIAGNOSIS — I4891 Unspecified atrial fibrillation: Principal | ICD-10-CM

## 2015-04-23 DIAGNOSIS — E785 Hyperlipidemia, unspecified: Secondary | ICD-10-CM

## 2015-04-23 DIAGNOSIS — Z720 Tobacco use: Secondary | ICD-10-CM

## 2015-04-23 DIAGNOSIS — E876 Hypokalemia: Secondary | ICD-10-CM

## 2015-04-23 LAB — BASIC METABOLIC PANEL
Anion gap: 7 (ref 5–15)
BUN: 9 mg/dL (ref 6–20)
CALCIUM: 9.2 mg/dL (ref 8.9–10.3)
CO2: 28 mmol/L (ref 22–32)
CREATININE: 0.86 mg/dL (ref 0.44–1.00)
Chloride: 105 mmol/L (ref 101–111)
GFR calc non Af Amer: >60 mL/min (ref >60)
Glucose, Bld: 129 mg/dL — ABNORMAL HIGH (ref 65–99)
Potassium: 3.4 mmol/L — ABNORMAL LOW (ref 3.5–5.1)
Sodium: 140 mmol/L (ref 135–145)

## 2015-04-23 LAB — GLUCOSE, CAPILLARY
GLUCOSE-CAPILLARY: 166 mg/dL — AB (ref 65–99)
Glucose-Capillary: 128 mg/dL — ABNORMAL HIGH (ref 65–99)
Glucose-Capillary: 120 mg/dL — ABNORMAL HIGH (ref 65–99)

## 2015-04-23 LAB — CBC
HCT: 38.7 % (ref 35.0–47.0)
Hemoglobin: 12.9 g/dL (ref 12.0–16.0)
MCH: 30.8 pg (ref 26.0–34.0)
MCHC: 33.4 g/dL (ref 32.0–36.0)
MCV: 92.4 fL (ref 80.0–100.0)
PLATELETS: 194 10*3/uL (ref 150–440)
RBC: 4.19 MIL/uL (ref 3.80–5.20)
RDW: 15.1 % — AB (ref 11.5–14.5)
WBC: 8.1 10*3/uL (ref 3.6–11.0)

## 2015-04-23 LAB — HEPARIN LEVEL (UNFRACTIONATED): HEPARIN UNFRACTIONATED: 0.49 [IU]/mL (ref 0.30–0.70)

## 2015-04-23 MED ORDER — DIGOXIN 250 MCG PO TABS: 0.2500 mg | ORAL_TABLET | Freq: Once | ORAL | Status: AC

## 2015-04-23 MED ORDER — METOPROLOL TARTRATE 25 MG PO TABS
25.0000 mg | ORAL_TABLET | Freq: Two times a day (BID) | ORAL | Status: DC
  Administered 2015-04-23: 25 mg via ORAL
  Filled 2015-04-23: qty 1

## 2015-04-23 MED ORDER — DILTIAZEM HCL 25 MG/5ML IV SOLN
10.0000 mg | Freq: Four times a day (QID) | INTRAVENOUS | Status: DC | PRN
  Administered 2015-04-23 – 2015-04-24 (×2): 10 mg via INTRAVENOUS
  Filled 2015-04-23 (×2): qty 5

## 2015-04-23 MED ORDER — METOPROLOL TARTRATE 25 MG PO TABS
25.0000 mg | ORAL_TABLET | Freq: Three times a day (TID) | ORAL | Status: DC
  Administered 2015-04-23 – 2015-04-25 (×7): 25 mg via ORAL
  Filled 2015-04-23 (×7): qty 1

## 2015-04-23 MED ORDER — POTASSIUM CHLORIDE CRYS ER 20 MEQ PO TBCR
40.0000 meq | EXTENDED_RELEASE_TABLET | Freq: Once | ORAL | Status: AC
  Administered 2015-04-23: 40 meq via ORAL
  Filled 2015-04-23: qty 2

## 2015-04-23 NOTE — Care Management Important Message (Signed)
Important Message  Patient Details  Name: Marigene Erler MRN: [id number] Date of Birth: [birth year]   Medicare Important Message Given:  Yes-second notification given    Darius Bump Allmond 04/23/2015, 9:58 AM

## 2015-04-23 NOTE — Consult Note (Signed)
Cardiology Consultation Note  Patient ID: Paula Goodman, MRN: [id number], DOB/AGE: October 09, 1945 69 y.o. Admit date: 04/21/2015   Date of Consult: 04/23/2015 Primary Physician: Sherrin Daisy, MD Primary Cardiologist: New to [facility]  Chief Complaint: SOB Reason for Consult: New onset Afib with RVR with rates into the 140's  HPI: 69 y.o. female with h/o DM2, HTN, HLD, ongoing tobacco abuse, and obesity who presented to [facility] on 8/29 with 2 week history of increasing exertional dyspnea, now at rest as well, and was found to be in new onset Afib with RVR with rates into the 140's.   She has never seen a cardiologist before. No prior ischemic evaluations. She last saw her PCP in early July 2016 for follow up diabetes OV and was still smoking at that time, reporting it difficult to quit. Last 3 office weights at her PCP are as follows: on 12/03/2014: 279 lb, on 12/11/2014: 278 lb, on 02/27/2015: 278 lb. Last A1C on 7/6 was 7.9%, this was improved from 8.3% on 12/11/2014.   She has been experiencing a 2 week history of increasing exertional dyspnea when walking out to the mail box. Over the past 2 weeks she was asymptomatic while at rest. However, over the past 2-3 days she became symptomatic at rest with increased dyspnea prompting her to come in for evaluation. Also with associated palpitations, stating her heart will just be "racing." SOme associated orthopnea. No chest pain, diaphoresis, nausea, vomiting, presyncope, or syncope. She has noted some increased LEE, mostly along the right lower extremity more so than the left lower extremity. No prior history of arrhythmia.   Upon her arrival to [facility] she was found to be in Afib with RVR with heart rates in the 140's. She received IV Cardizem, however her BP was somewhat soft in the 90's/60's which precluded further titration. She was transitioned to PO Cardizem 60 mg qid and metoprolol 25 mg bid. Thus she was started on digoxin 0.25 mg without loading dose. She  was also started on a heparin gtt given her CHADSVASc of at least 4 (HTN, age x 1, diabetes, female), giving her an estimated annual stroke risk of 4.0%. She was continued on lisinopril/HCTZ 20/25 mg. She was found to be hypokalemic with K+ of 3.2-->3.0-->3.3-->3.4. Troponin negative x 4. TSH normal. CBC unremarkable. CXR with mild vascular congestion. CTA chest with no acute cardiopulmonary disease and no evidence of PE. It did show a 5-6 mm nodule over the lingula. Follow imaging was recommended in 6 months. Her heart rate has been well controlled when she is at rest, however with minimal movement, even in the bed, her heart rate will become tachycardic into the 1-teens to 140's. She remains in Afib a this time and is SOB with movement.      Past Medical History  Diagnosis Date  . Diabetes mellitus   . Hypertension   . Obesity   . HLD (hyperlipidemia)   . Tobacco abuse   . GERD (gastroesophageal reflux disease)   . Fibrocystic breast disease       Most Recent Cardiac Studies: Echo from 8/29 is pending   Surgical History: History reviewed. No pertinent past surgical history.   Home Meds: Prior to Admission medications   Medication Sig Start Date End Date Taking? Authorizing Provider  albuterol (PROVENTIL HFA;VENTOLIN HFA) 108 (90 BASE) MCG/ACT inhaler Inhale 2 puffs into the lungs every 6 (six) hours as needed for wheezing or shortness of breath.   Yes Historical Provider, MD  colchicine  0.6 MG tablet Take 0.6 mg by mouth daily as needed. For gout flare up.   Yes Historical Provider, MD  gabapentin (NEURONTIN) 300 MG capsule Take 300 mg by mouth at bedtime.   Yes Historical Provider, MD  glimepiride (AMARYL) 2 MG tablet Take 2 mg by mouth daily. 02/27/15  Yes Historical Provider, MD  glucose (SUNMARK GLUCOSE) 4 GM chewable tablet Chew 8 g by mouth daily as needed. For low blood sugar.   Yes Historical Provider, MD  ketoconazole (NIZORAL) 2 % cream Apply 1 application topically 2 (two) times  daily as needed. For rash 04/02/14  Yes Historical Provider, MD  lisinopril-hydrochlorothiazide (PRINZIDE,ZESTORETIC) 20-25 MG per tablet Take 1 tablet by mouth daily. 02/28/15  Yes Historical Provider, MD  lovastatin (MEVACOR) 40 MG tablet Take 40 mg by mouth every evening. With dinner. 02/27/15  Yes Historical Provider, MD  metFORMIN (GLUCOPHAGE) 1000 MG tablet Take 1,000 mg by mouth 2 (two) times daily with a meal. 03/30/15  Yes Historical Provider, MD  omeprazole (PRILOSEC) 20 MG capsule Take 20 mg by mouth daily. 02/27/15  Yes Historical Provider, MD    Inpatient Medications:  . digoxin  0.25 mg Oral Daily  . diltiazem  60 mg Oral 4 times per day  . gabapentin  300 mg Oral QHS  . glimepiride  2 mg Oral Daily  . metoprolol tartrate  25 mg Oral BID  . pantoprazole  40 mg Oral Daily  . potassium chloride  40 mEq Oral BID  . pravastatin  10 mg Oral q1800  . sodium chloride  3 mL Intravenous Q12H   . heparin 1,400 Units/hr (04/22/15 1140)    Allergies: No Known Allergies  Social History   Social History  . Marital Status: Widowed    Spouse Name: N/A  . Number of Children: N/A  . Years of Education: N/A   Occupational History  . Not on file.   Social History Main Topics  . Smoking status: Current Every Day Smoker  . Smokeless tobacco: Not on file  . Alcohol Use: Not on file  . Drug Use: Not on file  . Sexual Activity: Not on file   Other Topics Concern  . Not on file   Social History Narrative     Family History  Problem Relation Age of Onset  . Hypertension Mother   . Diabetes Mother      Review of Systems: Review of Systems  Constitutional: Positive for malaise/fatigue. Negative for fever, chills, weight loss and diaphoresis.  HENT: Negative for congestion.   Eyes: Negative for discharge and redness.  Respiratory: Positive for cough and shortness of breath. Negative for hemoptysis, sputum production and wheezing.   Cardiovascular: Positive for palpitations, leg  swelling and PND. Negative for chest pain, orthopnea and claudication.       RLE>LLE  Gastrointestinal: Negative for heartburn, nausea, vomiting, abdominal pain, blood in stool and melena.  Genitourinary: Negative for hematuria.  Musculoskeletal: Negative for myalgias and falls.  Skin: Negative for rash.  Neurological: Positive for weakness. Negative for dizziness, sensory change, speech change and focal weakness.  Endo/Heme/Allergies: Does not bruise/bleed easily.  Psychiatric/Behavioral: Positive for substance abuse. The patient is not nervous/anxious.        Ongoing tobacco abuse     Labs:  Recent Labs  04/21/15 1804 04/21/15 2352 04/22/15 0544 04/22/15 1103  TROPONINI <0.03 <0.03 <0.03 <0.03   Lab Results  Component Value Date   WBC 8.1 04/23/2015   HGB 12.9 04/23/2015  HCT 38.7 04/23/2015   MCV 92.4 04/23/2015   PLT 194 04/23/2015    Recent Labs Lab 04/21/15 1804  04/23/15 0558  NA 141  < > 140  K 3.2*  < > 3.4*  CL 103  < > 105  CO2 26  < > 28  BUN 14  < > 9  CREATININE 1.12*  < > 0.86  CALCIUM 9.1  < > 9.2  PROT 7.3  --   --   BILITOT 0.5  --   --   ALKPHOS 71  --   --   ALT 28  --   --   AST 31  --   --   GLUCOSE 114*  < > 129*  < > = values in this interval not displayed. No results found for: CHOL, HDL, LDLCALC, TRIG No results found for: DDIMER  Radiology/Studies:  Ct Angio Chest Pe W/cm &/or Wo Cm  04/21/2015   CLINICAL DATA:  Progressive dyspnea 1-2 weeks worse today with tachycardia. Smoker.  EXAM: CT ANGIOGRAPHY CHEST WITH CONTRAST  TECHNIQUE: Multidetector CT imaging of the chest was performed using the standard protocol during bolus administration of intravenous contrast. Multiplanar CT image reconstructions and MIPs were obtained to evaluate the vascular anatomy.  CONTRAST:  54mL OMNIPAQUE IOHEXOL 350 MG/ML SOLN  COMPARISON:  Chest x-ray today.  FINDINGS: Lungs are adequately inflated without focal consolidation or effusion. There is minimal  linear atelectasis/scarring over the lingula. There is a 5-6 mm nodular density over the lingula. Airways are within normal.  There is mild cardiomegaly. There is no evidence of pulmonary embolism. There is no significant hilar adenopathy. There is a 1.3 cm precarinal lymph node likely reactive. Ascending thoracic aorta measures 3.7 cm in AP diameter. No axillary adenopathy. Remaining mediastinal structures are unremarkable.  Limited images through the upper abdomen are within normal. There are mild degenerate changes of the spine.  Review of the MIP images confirms the above findings.  IMPRESSION: No acute cardiopulmonary disease and no evidence of pulmonary embolism.  5-6 mm nodule over the lingula. Recommend followup chest CT 6 months. This recommendation follows the consensus statement: Guidelines for Management of Small Pulmonary Nodules Detected on CT Scans: A Statement from the [place] as published in Radiology 2005; 237:395-400. Online at: https://www.arnold.com/.  1.3 cm precarinal lymph node likely reactive.  Mild cardiomegaly. 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 Recommend annual imaging followup by CTA or MRA. This recommendation follows 2010 ACCF/AHA/AATS/ACR/ASA/SCA/SCAI/SIR/STS/SVM Guidelines for the Diagnosis and Management of Patients with Thoracic Aortic Disease. Circulation.2010; 121: K998-P382.   Electronically Signed   By: Marin Olp M.D.   On: 04/21/2015 20:23   Dg Chest Port 1 View  04/21/2015   CLINICAL DATA:  Worsening shortness of breath since yesterday.  EXAM: PORTABLE CHEST - 1 VIEW  COMPARISON:  09/08/2008  FINDINGS: Lungs are adequately inflated with minimal prominence of the perihilar markings. No evidence of effusion or focal consolidation. Cardiomediastinal silhouette and remainder of the exam is within normal.  IMPRESSION: Evidence of mild vascular congestion.   Electronically Signed   By:  Marin Olp M.D.   On: 04/21/2015 18:14    EKG: Afib with RVR, 145 bpm, low voltage, poor R wave progression, cannot rule out anterior infarct, inferior st/t changes    Weights: Filed Weights   04/21/15 1951  Weight: 287 lb 6.4 oz (130.364 kg)     Physical Exam: Blood pressure 113/76, pulse 109, temperature  97.7 F (36.5 C), temperature source Oral, resp. rate 19, height 5\' 7"  (1.702 m), weight 287 lb 6.4 oz (130.364 kg), SpO2 94 %. Body mass index is 45 kg/(m^2). General: Well developed, well nourished, in no acute distress. Head: Normocephalic, atraumatic, sclera non-icteric, no xanthomas, nares are without discharge.  Neck: Negative for carotid bruits. JVD not elevated. Lungs: Clear bilaterally to auscultation without wheezes, rales, or rhonchi. Breathing is unlabored. Heart: Irregularly-irregular, tachycardic, with S1 S2. No murmurs, rubs, or gallops appreciated. Abdomen: Obese, soft, non-tender, non-distended with normoactive bowel sounds. No hepatomegaly. No rebound/guarding. No obvious abdominal masses. Msk:  Strength and tone appear normal for age. Extremities: No clubbing or cyanosis. Trace non-pitting edema R>L lower extremity.  Distal pedal pulses are 2+ and equal bilaterally. Neuro: Alert and oriented X 3. No facial asymmetry. No focal deficit. Moves all extremities spontaneously. Psych:  Responds to questions appropriately with a normal affect.    Assessment and Plan:  69 y.o. female with h/o DM2, HTN, HLD, ongoing tobacco abuse, and obesity who presented to [facility] on 8/29 with 2 week history of increasing exertional dyspnea, now at rest as well, and was found to be in new onset Afib with RVR with rates into the 140's.  1. New onset Afib with RVR: -Has been experiencing increased dyspnea over the past 2 weeks, initially with exertion only, now at rest as well -High probably that she was in Afib at that time -Will need to continue rate control as she was not on full dose  anticoagulation upon admission  -Will give a one time digoxin bolus of 0.5 mg PO today, resume 0.25 mg digoxin on 8/31 with digoxin level in the morning  -Increase metoprolol to 25 mg tid with hold parameters  -Continue diltiazem 60 mg q 6 hours -Lisinopril/HCTZ have been held to allow for more room for rate control -Possibly in the setting of hypokalemia   -On heparin gtt at this time, continue pending possible procedures -Plan to change to Eliquis vs Xarelto given her CHADSVASc of 4 (HTN, diabetes, age x 1, female), giving her an estimated annual stroke risk of 4.0%  2. Increased dyspnea: -In the setting of Afib with RVR -Cannot rule out decompensated heart failure  -Likely holding on to excess volume in the setting of her new onset Afib with RVR -Check echo to evaluate LV function, wall motion, and right side pressure   3. Hypokalemia: -Likely in the setting of HCTZ -Replete to 4.0  4. HTN: -Stable, somewhat soft -Continue current medications as above  5. HLD: -Continue pravastatin   6. Ongoing tobacco abuse: -Cessation advised   Melvern Banker, PA-C Pager: [phone] 04/23/2015, 10:40 AM

## 2015-04-23 NOTE — Progress Notes (Signed)
[facility]                                                                                                                                                                                            Patient Demographics   Paula Goodman, is a 69 y.o. female, DOB - 04-Dec-1945, NWG:[id number]  Admit date - 04/21/2015   Admitting Physician Baxter Hire, MD  Outpatient Primary MD for the patient is Sherrin Daisy, MD   LOS - 2  Subjective: contineus to be sob, hr poor control. Echo results pending    Review of Systems:   CONSTITUTIONAL: No documented fever. No fatigue, weakness. No weight gain, no weight loss.  EYES: No blurry or double vision.  ENT: No tinnitus. No postnasal drip. No redness of the oropharynx.  RESPIRATORY: No cough, no wheeze, no hemoptysis. Positive dyspnea.  CARDIOVASCULAR: No chest pain. No orthopnea. Positive palpitations. No syncope.  GASTROINTESTINAL: No nausea, no vomiting or diarrhea. No abdominal pain. No melena or hematochezia.  GENITOURINARY: No dysuria or hematuria.  ENDOCRINE: No polyuria or nocturia. No heat or cold intolerance.  HEMATOLOGY: No anemia. No bruising. No bleeding.  INTEGUMENTARY: No rashes. No lesions.  MUSCULOSKELETAL: No arthritis. No swelling. No gout.  NEUROLOGIC: No numbness, tingling, or ataxia. No seizure-type activity.  PSYCHIATRIC: No anxiety. No insomnia. No ADD.    Vitals:   Filed Vitals:   04/22/15 1400 04/22/15 1705 04/22/15 2047 04/23/15 0442  BP: 116/91 119/77 108/82 113/76  Pulse: 48 89 80 109  Temp:  97.8 F (36.6 C) 98.3 F (36.8 C) 97.7 F (36.5 C)  TempSrc:  Oral    Resp: 32 18 19 19   Height:      Weight:      SpO2: 92% 92% 97% 94%    Wt Readings from Last 3 Encounters:  04/21/15 130.364 kg (287 lb 6.4 oz)     Intake/Output Summary (Last 24 hours) at 04/23/15 1230 Last data filed at 04/23/15 5784  Gross per 24 hour  Intake     28 ml  Output    950  ml  Net   -922 ml    Physical Exam:   GENERAL: Pleasant-appearing in no apparent distress.  HEAD, EYES, EARS, NOSE AND THROAT: Atraumatic, normocephalic. Extraocular muscles are intact. Pupils equal and reactive to light. Sclerae anicteric. No conjunctival injection. No oro-pharyngeal erythema.  NECK: Supple. There is no jugular venous distention. No bruits, no lymphadenopathy, no thyromegaly.  HEART: Irregularly irregular rate and rhythm,. No murmurs, no rubs, no clicks.  LUNGS: Clear to auscultation bilaterally. No rales or rhonchi.  No wheezes.  ABDOMEN: Soft, flat, nontender, nondistended. Has good bowel sounds. No hepatosplenomegaly appreciated.  EXTREMITIES: No evidence of any cyanosis, clubbing, or peripheral edema.  +2 pedal and radial pulses bilaterally.  NEUROLOGIC: The patient is alert, awake, and oriented x3 with no focal motor or sensory deficits appreciated bilaterally.  SKIN: Moist and warm with no rashes appreciated.  Psych: Not anxious, depressed LN: No inguinal LN enlargement    Antibiotics   Anti-infectives    None      Medications   Scheduled Meds: . digoxin  0.25 mg Oral Daily  . digoxin  0.25 mg Oral Once  . diltiazem  60 mg Oral 4 times per day  . gabapentin  300 mg Oral QHS  . glimepiride  2 mg Oral Daily  . metoprolol tartrate  25 mg Oral 3 times per day  . pantoprazole  40 mg Oral Daily  . potassium chloride  40 mEq Oral BID  . pravastatin  10 mg Oral q1800  . sodium chloride  3 mL Intravenous Q12H   Continuous Infusions: . heparin 1,400 Units/hr (04/22/15 1140)   PRN Meds:.sodium chloride, acetaminophen **OR** acetaminophen, diltiazem, sodium chloride   Data Review:   Micro Results Recent Results (from the past 240 hour(s))  MRSA PCR Screening     Status: None   Collection Time: 04/21/15 11:10 PM  Result Value Ref Range Status   MRSA by PCR NEGATIVE NEGATIVE Final    Comment:        The GeneXpert MRSA Assay (FDA approved for NASAL  specimens only), is one component of a comprehensive MRSA colonization surveillance program. It is not intended to diagnose MRSA infection nor to guide or monitor treatment for MRSA infections.     Radiology Reports Ct Angio Chest Pe W/cm &/or Wo Cm  04/21/2015   CLINICAL DATA:  Progressive dyspnea 1-2 weeks worse today with tachycardia. Smoker.  EXAM: CT ANGIOGRAPHY CHEST WITH CONTRAST  TECHNIQUE: Multidetector CT imaging of the chest was performed using the standard protocol during bolus administration of intravenous contrast. Multiplanar CT image reconstructions and MIPs were obtained to evaluate the vascular anatomy.  CONTRAST:  75mL OMNIPAQUE IOHEXOL 350 MG/ML SOLN  COMPARISON:  Chest x-ray today.  FINDINGS: Lungs are adequately inflated without focal consolidation or effusion. There is minimal linear atelectasis/scarring over the lingula. There is a 5-6 mm nodular density over the lingula. Airways are within normal.  There is mild cardiomegaly. There is no evidence of pulmonary embolism. There is no significant hilar adenopathy. There is a 1.3 cm precarinal lymph node likely reactive. Ascending thoracic aorta measures 3.7 cm in AP diameter. No axillary adenopathy. Remaining mediastinal structures are unremarkable.  Limited images through the upper abdomen are within normal. There are mild degenerate changes of the spine.  Review of the MIP images confirms the above findings.  IMPRESSION: No acute cardiopulmonary disease and no evidence of pulmonary embolism.  5-6 mm nodule over the lingula. Recommend followup chest CT 6 months. This recommendation follows the consensus statement: Guidelines for Management of Small Pulmonary Nodules Detected on CT Scans: A Statement from the [place] as published in Radiology 2005; 237:395-400. Online at: https://www.arnold.com/.  1.3 cm precarinal lymph node likely reactive.  Mild cardiomegaly. Mild ectasia of the ascending  thoracic aorta measuring 3.7 cm in AP diameter. Recommend annual imaging followup by CTA or MRA. This recommendation follows 2010 ACCF/AHA/AATS/ACR/ASA/SCA/SCAI/SIR/STS/SVM Guidelines for the Diagnosis and Management of Patients with Thoracic Aortic Disease. Circulation.2010; 121: P824-M353.   Electronically  Signed   By: Marin Olp M.D.   On: 04/21/2015 20:23   Dg Chest Port 1 View  04/21/2015   CLINICAL DATA:  Worsening shortness of breath since yesterday.  EXAM: PORTABLE CHEST - 1 VIEW  COMPARISON:  09/08/2008  FINDINGS: Lungs are adequately inflated with minimal prominence of the perihilar markings. No evidence of effusion or focal consolidation. Cardiomediastinal silhouette and remainder of the exam is within normal.  IMPRESSION: Evidence of mild vascular congestion.   Electronically Signed   By: Marin Olp M.D.   On: 04/21/2015 18:14     CBC  Recent Labs Lab 04/21/15 1804 04/22/15 0813 04/23/15 0558  WBC 9.5 7.7 8.1  HGB 13.3 12.4 12.9  HCT 40.6 37.6 38.7  PLT 235 200 194  MCV 91.5 91.4 92.4  MCH 30.0 30.1 30.8  MCHC 32.8 32.9 33.4  RDW 14.9* 14.9* 15.1*    Chemistries   Recent Labs Lab 04/21/15 1804 04/22/15 0544 04/22/15 1256 04/23/15 0558  NA 141 142 140 140  K 3.2* 3.0* 3.3* 3.4*  CL 103 106 104 105  CO2 26 29 28 28   GLUCOSE 114* 114* 172* 129*  BUN 14 11 10 9   CREATININE 1.12* 0.93 0.90 0.86  CALCIUM 9.1 8.8* 9.0 9.2  AST 31  --   --   --   ALT 28  --   --   --   ALKPHOS 71  --   --   --   BILITOT 0.5  --   --   --    ------------------------------------------------------------------------------------------------------------------ estimated creatinine clearance is 89.3 mL/min (by C-G formula based on Cr of 0.86). ------------------------------------------------------------------------------------------------------------------ No results for input(s): HGBA1C in the last 72  hours. ------------------------------------------------------------------------------------------------------------------ No results for input(s): CHOL, HDL, LDLCALC, TRIG, CHOLHDL, LDLDIRECT in the last 72 hours. ------------------------------------------------------------------------------------------------------------------  Recent Labs  04/22/15 1256  TSH 0.807   ------------------------------------------------------------------------------------------------------------------ No results for input(s): VITAMINB12, FOLATE, FERRITIN, TIBC, IRON, RETICCTPCT in the last 72 hours.  Coagulation profile  Recent Labs Lab 04/21/15 1804  INR 1.02    No results for input(s): DDIMER in the last 72 hours.  Cardiac Enzymes  Recent Labs Lab 04/21/15 2352 04/22/15 0544 04/22/15 1103  TROPONINI <0.03 <0.03 <0.03   ------------------------------------------------------------------------------------------------------------------ Invalid input(s): POCBNP    Assessment & Plan   1.  Atrial Fibrilation with RVR: Patient started on digoxin. I will also start her on metoprolol due to heart rate being poor control. Cardiology consult has been placed.  2. Shortness of Breath: may be due to atrial fibCT scan per PE PE negative for any pulmonary pathology  3. HTNContinue Cardizem and discontinue other blood pressure meds due to blood pressure being low   4. DM: Contorlled with home meds.Sliding scale insulin   5. Right lower extremity swelling obtain a Doppler of the right lower extremity      Code Status Orders        Start     Ordered   04/21/15 2313  Full code   Continuous     04/21/15 2312           Consults cardiology   DVT Prophylaxis  heparin   Lab Results  Component Value Date   PLT 194 04/23/2015     Time Spent in minutes  35 minutes     Jarvis Knodel, [facility] M.D on 04/23/2015 at 12:30 PM  Between 7am to 6pm - Pager - [phone]  After 6pm go to  [email or web address] - [facility]  Office  [phone]

## 2015-04-23 NOTE — Progress Notes (Signed)
ANTICOAGULATION CONSULT NOTE - Initial Consult  Pharmacy Consult for Heparin Indication: atrial fibrillation  No Known Allergies  Patient Measurements: Height: 5\' 7"  (170.2 cm) Weight: 287 lb 6.4 oz (130.364 kg) IBW/kg (Calculated) : 61.6 Heparin Dosing Weight: 93 kg  Vital Signs: Temp: 97.7 F (36.5 C) (08/30 0442) BP: 113/76 mmHg (08/30 0442) Pulse Rate: 109 (08/30 0442)  Labs:  Recent Labs  04/21/15 1804 04/21/15 2352 04/22/15 0207 04/22/15 0544 04/22/15 0813 04/22/15 1103 04/22/15 1256 04/23/15 0558  HGB 13.3  --   --   --  12.4  --   --  12.9  HCT 40.6  --   --   --  37.6  --   --  38.7  PLT 235  --   --   --  200  --   --  194  APTT 27  --   --   --   --   --   --   --   LABPROT 13.6  --   --   --   --   --   --   --   INR 1.02  --   --   --   --   --   --   --   HEPARINUNFRC  --   --  0.42  --  0.42  --   --  0.49  CREATININE 1.12*  --   --  0.93  --   --  0.90 0.86  TROPONINI <0.03 <0.03  --  <0.03  --  <0.03  --   --     Estimated Creatinine Clearance: 89.3 mL/min (by C-G formula based on Cr of 0.86).   Medical History: Past Medical History  Diagnosis Date  . Diabetes mellitus without complication   . Hypertension     Medications:  Prescriptions prior to admission  Medication Sig Dispense Refill Last Dose  . albuterol (PROVENTIL HFA;VENTOLIN HFA) 108 (90 BASE) MCG/ACT inhaler Inhale 2 puffs into the lungs every 6 (six) hours as needed for wheezing or shortness of breath.   04/18/2015  . colchicine 0.6 MG tablet Take 0.6 mg by mouth daily as needed. For gout flare up.   Past Month at Unknown time  . gabapentin (NEURONTIN) 300 MG capsule Take 300 mg by mouth at bedtime.   04/20/2015 at Unknown time  . glimepiride (AMARYL) 2 MG tablet Take 2 mg by mouth daily.  1 04/21/2015 at Unknown time  . glucose (SUNMARK GLUCOSE) 4 GM chewable tablet Chew 8 g by mouth daily as needed. For low blood sugar.   prn  . ketoconazole (NIZORAL) 2 % cream Apply 1 application  topically 2 (two) times daily as needed. For rash   04/18/2015  . lisinopril-hydrochlorothiazide (PRINZIDE,ZESTORETIC) 20-25 MG per tablet Take 1 tablet by mouth daily.  1 04/21/2015 at Unknown time  . lovastatin (MEVACOR) 40 MG tablet Take 40 mg by mouth every evening. With dinner.  1 04/20/2015 at Unknown time  . metFORMIN (GLUCOPHAGE) 1000 MG tablet Take 1,000 mg by mouth 2 (two) times daily with a meal.  4 04/21/2015 at Unknown time  . omeprazole (PRILOSEC) 20 MG capsule Take 20 mg by mouth daily.  1 04/21/2015 at Unknown time    Assessment: 69 y/o F with afib on heparin drip and cardizem drip.   Goal of Therapy:  Heparin level 0.3-0.7 units/ml Monitor platelets by anticoagulation protocol: Yes   Plan:  Heparin level is at goal so will continue heparin drip at  1400 units and f/u AM labs.   Laural Benes, Pharm.D. Clinical Pharmacist  04/23/2015,6:31 AM

## 2015-04-24 DIAGNOSIS — I483 Typical atrial flutter: Secondary | ICD-10-CM

## 2015-04-24 DIAGNOSIS — R0601 Orthopnea: Secondary | ICD-10-CM

## 2015-04-24 LAB — DIGOXIN LEVEL: DIGOXIN LVL: 0.5 ng/mL — AB (ref 0.8–2.0)

## 2015-04-24 LAB — GLUCOSE, CAPILLARY
GLUCOSE-CAPILLARY: 85 mg/dL (ref 65–99)
GLUCOSE-CAPILLARY: 145 mg/dL — AB (ref 65–99)

## 2015-04-24 LAB — BASIC METABOLIC PANEL
ANION GAP: 8 (ref 5–15)
BUN: 12 mg/dL (ref 6–20)
CALCIUM: 9 mg/dL (ref 8.9–10.3)
CO2: 28 mmol/L (ref 22–32)
Chloride: 107 mmol/L (ref 101–111)
Creatinine, Ser: 0.97 mg/dL (ref 0.44–1.00)
GFR, EST NON AFRICAN AMERICAN: 59 mL/min — AB (ref >60)
GLUCOSE: 139 mg/dL — AB (ref 65–99)
POTASSIUM: 3.8 mmol/L (ref 3.5–5.1)
SODIUM: 143 mmol/L (ref 135–145)

## 2015-04-24 LAB — CBC
HCT: 40.1 % (ref 35.0–47.0)
HEMOGLOBIN: 12.8 g/dL (ref 12.0–16.0)
MCH: 29.5 pg (ref 26.0–34.0)
MCHC: 32 g/dL (ref 32.0–36.0)
MCV: 92.3 fL (ref 80.0–100.0)
PLATELETS: 206 10*3/uL (ref 150–440)
RBC: 4.34 MIL/uL (ref 3.80–5.20)
RDW: 15.5 % — ABNORMAL HIGH (ref 11.5–14.5)
WBC: 9.5 10*3/uL (ref 3.6–11.0)

## 2015-04-24 LAB — MAGNESIUM: MAGNESIUM: 2 mg/dL (ref 1.7–2.4)

## 2015-04-24 LAB — HEPARIN LEVEL (UNFRACTIONATED): HEPARIN UNFRACTIONATED: 0.33 [IU]/mL (ref 0.30–0.70)

## 2015-04-24 MED ORDER — FUROSEMIDE 10 MG/ML IJ SOLN
40.0000 mg | Freq: Once | INTRAMUSCULAR | Status: AC
  Administered 2015-04-24: 40 mg via INTRAVENOUS
  Filled 2015-04-24: qty 4

## 2015-04-24 MED ORDER — SODIUM CHLORIDE 0.9 % IJ SOLN: 3.0000 mL | Freq: Two times a day (BID) | INTRAMUSCULAR | Status: DC

## 2015-04-24 MED ORDER — SODIUM CHLORIDE 0.9 % IV SOLN: INTRAVENOUS | Status: DC

## 2015-04-24 MED ORDER — APIXABAN 5 MG PO TABS
5.0000 mg | ORAL_TABLET | Freq: Two times a day (BID) | ORAL | Status: DC
  Administered 2015-04-24 – 2015-04-26 (×5): 5 mg via ORAL
  Filled 2015-04-24 (×5): qty 1

## 2015-04-24 MED ORDER — INSULIN ASPART 100 UNIT/ML ~~LOC~~ SOLN
0.0000 [IU] | Freq: Three times a day (TID) | SUBCUTANEOUS | Status: DC
  Administered 2015-04-25: 2 [IU] via SUBCUTANEOUS
  Filled 2015-04-24: qty 2

## 2015-04-24 MED ORDER — FUROSEMIDE 20 MG PO TABS
20.0000 mg | ORAL_TABLET | Freq: Every day | ORAL | Status: DC
  Administered 2015-04-24 – 2015-04-26 (×3): 20 mg via ORAL
  Filled 2015-04-24 (×3): qty 1

## 2015-04-24 MED ORDER — SODIUM CHLORIDE 0.9 % IJ SOLN: 3.0000 mL | INTRAMUSCULAR | Status: DC | PRN

## 2015-04-24 MED ORDER — SODIUM CHLORIDE 0.9 % IV SOLN
INTRAVENOUS | Status: DC
  Administered 2015-04-26: 08:00:00 via INTRAVENOUS

## 2015-04-24 MED ORDER — SODIUM CHLORIDE 0.9 % IV SOLN: 250.0000 mL | INTRAVENOUS | Status: DC

## 2015-04-24 MED ORDER — SODIUM CHLORIDE 0.9 % IV SOLN
250.0000 mL | INTRAVENOUS | Status: DC
Start: 2015-04-26 — End: 2015-04-26

## 2015-04-24 NOTE — Progress Notes (Signed)
ANTICOAGULATION CONSULT NOTE - Initial Consult  Pharmacy Consult for Heparin Indication: atrial fibrillation  No Known Allergies  Patient Measurements: Height: 5\' 7"  (170.2 cm) Weight: 279 lb 14.4 oz (126.962 kg) IBW/kg (Calculated) : 61.6 Heparin Dosing Weight: 93 kg  Vital Signs: Temp: 97.9 F (36.6 C) (08/31 0453) Temp Source: Oral (08/31 0453) BP: 117/74 mmHg (08/31 0453) Pulse Rate: 70 (08/31 0453)  Labs:  Recent Labs  04/21/15 1804 04/21/15 2352  04/22/15 0544 04/22/15 0813 04/22/15 1103 04/22/15 1256 04/23/15 0558 04/24/15 0514  HGB 13.3  --   --   --  12.4  --   --  12.9  --   HCT 40.6  --   --   --  37.6  --   --  38.7  --   PLT 235  --   --   --  200  --   --  194  --   APTT 27  --   --   --   --   --   --   --   --   LABPROT 13.6  --   --   --   --   --   --   --   --   INR 1.02  --   --   --   --   --   --   --   --   HEPARINUNFRC  --   --   < >  --  0.42  --   --  0.49 0.33  CREATININE 1.12*  --   --  0.93  --   --  0.90 0.86 0.97  TROPONINI <0.03 <0.03  --  <0.03  --  <0.03  --   --   --   < > = values in this interval not displayed.  Estimated Creatinine Clearance: 78 mL/min (by C-G formula based on Cr of 0.97).   Medical History: Past Medical History  Diagnosis Date  . Diabetes mellitus   . Hypertension   . Obesity   . HLD (hyperlipidemia)   . Tobacco abuse   . GERD (gastroesophageal reflux disease)   . Fibrocystic breast disease     Medications:  Prescriptions prior to admission  Medication Sig Dispense Refill Last Dose  . albuterol (PROVENTIL HFA;VENTOLIN HFA) 108 (90 BASE) MCG/ACT inhaler Inhale 2 puffs into the lungs every 6 (six) hours as needed for wheezing or shortness of breath.   04/18/2015  . colchicine 0.6 MG tablet Take 0.6 mg by mouth daily as needed. For gout flare up.   Past Month at Unknown time  . gabapentin (NEURONTIN) 300 MG capsule Take 300 mg by mouth at bedtime.   04/20/2015 at Unknown time  . glimepiride (AMARYL) 2  MG tablet Take 2 mg by mouth daily.  1 04/21/2015 at Unknown time  . glucose (SUNMARK GLUCOSE) 4 GM chewable tablet Chew 8 g by mouth daily as needed. For low blood sugar.   prn  . ketoconazole (NIZORAL) 2 % cream Apply 1 application topically 2 (two) times daily as needed. For rash   04/18/2015  . lisinopril-hydrochlorothiazide (PRINZIDE,ZESTORETIC) 20-25 MG per tablet Take 1 tablet by mouth daily.  1 04/21/2015 at Unknown time  . lovastatin (MEVACOR) 40 MG tablet Take 40 mg by mouth every evening. With dinner.  1 04/20/2015 at Unknown time  . metFORMIN (GLUCOPHAGE) 1000 MG tablet Take 1,000 mg by mouth 2 (two) times daily with a meal.  4 04/21/2015 at Unknown time  .  omeprazole (PRILOSEC) 20 MG capsule Take 20 mg by mouth daily.  1 04/21/2015 at Unknown time    Assessment: 69 y/o F with afib on heparin drip and cardizem drip.   Goal of Therapy:  Heparin level 0.3-0.7 units/ml Monitor platelets by anticoagulation protocol: Yes   Plan:  Heparin level is at goal so will continue heparin drip at 1400 units and f/u AM labs.   Laural Benes, Pharm.D. Clinical Pharmacist  04/24/2015,6:56 AM

## 2015-04-24 NOTE — Progress Notes (Signed)
Patient: Paula Goodman / Admit Date: 04/21/2015 / Date of Encounter: 04/24/2015, 11:16 AM   Subjective: Still SOB this time morning requiring IV Lasix at 3 AM with significant improvement. Unable to ambulate in the hallways on 8/30 secondary to SOB and heart rate. Heart rate has improved to the 70's. In atrial flutter. Echo showed EF 45-50%, normal wall motion, mild MR, left atrium was mildly dilated at  46 mm, PASP normal. She is uneasy about going home at this time given her persisting SOB. She received another PO Lasix 20 mg at 11 AM.   Review of Systems: Review of Systems  Constitutional: Positive for malaise/fatigue. Negative for fever, chills, weight loss and diaphoresis.  HENT: Negative for congestion.   Eyes: Negative for discharge and redness.  Respiratory: Positive for shortness of breath. Negative for cough, hemoptysis, sputum production and wheezing.   Cardiovascular: Positive for leg swelling and PND. Negative for chest pain, palpitations, orthopnea and claudication.       RLE>LLE  Gastrointestinal: Negative for heartburn, nausea, vomiting, blood in stool and melena.  Musculoskeletal: Negative for falls.  Skin: Negative for rash.  Neurological: Positive for weakness. Negative for sensory change, speech change and focal weakness.  Endo/Heme/Allergies: Does not bruise/bleed easily.  Psychiatric/Behavioral: The patient is nervous/anxious.   All other systems reviewed and are negative.   Objective: Telemetry: Atrial flutter, 70's Physical Exam: Blood pressure 112/84, pulse 77, temperature 97.7 F (36.5 C), temperature source Oral, resp. rate 20, height 5\' 7"  (1.702 m), weight 279 lb 14.4 oz (126.962 kg), SpO2 97 %. Body mass index is 43.83 kg/(m^2). General: Well developed, well nourished, in no acute distress. Head: Normocephalic, atraumatic, sclera non-icteric, no xanthomas, nares are without discharge. Neck: Negative for carotid bruits. JVP not elevated. Lungs: Clear  bilaterally to auscultation without wheezes, rales, or rhonchi. Breathing is unlabored. Heart: Irregular, S1 S2 without murmurs, rubs, or gallops.  Abdomen: Obese, soft, non-tender, non-distended with normoactive bowel sounds. No rebound/guarding. Extremities: No clubbing or cyanosis. No edema. Distal pedal pulses are 2+ and equal bilaterally. Neuro: Alert and oriented X 3. Moves all extremities spontaneously. Psych:  Responds to questions appropriately with a normal affect.   Intake/Output Summary (Last 24 hours) at 04/24/15 1116 Last data filed at 04/24/15 0900  Gross per 24 hour  Intake    628 ml  Output   2901 ml  Net  -2273 ml    Inpatient Medications:  . apixaban  5 mg Oral BID  . digoxin  0.25 mg Oral Daily  . diltiazem  60 mg Oral 4 times per day  . furosemide  20 mg Oral Daily  . gabapentin  300 mg Oral QHS  . glimepiride  2 mg Oral Daily  . metoprolol tartrate  25 mg Oral 3 times per day  . pantoprazole  40 mg Oral Daily  . potassium chloride  40 mEq Oral BID  . pravastatin  10 mg Oral q1800   Infusions:    Labs:  Recent Labs  04/23/15 0558 04/24/15 0514  NA 140 143  K 3.4* 3.8  CL 105 107  CO2 28 28  GLUCOSE 129* 139*  BUN 9 12  CREATININE 0.86 0.97  CALCIUM 9.2 9.0  MG  --  2.0    Recent Labs  04/21/15 1804  AST 31  ALT 28  ALKPHOS 71  BILITOT 0.5  PROT 7.3  ALBUMIN 4.2    Recent Labs  04/23/15 0558 04/24/15 0515  WBC 8.1 9.5  HGB 12.9 12.8  HCT 38.7 40.1  MCV 92.4 92.3  PLT 194 206    Recent Labs  04/21/15 1804 04/21/15 2352 04/22/15 0544 04/22/15 1103  TROPONINI <0.03 <0.03 <0.03 <0.03   Invalid input(s): POCBNP No results for input(s): HGBA1C in the last 72 hours.   Weights: Filed Weights   04/21/15 1951 04/24/15 0453  Weight: 287 lb 6.4 oz (130.364 kg) 279 lb 14.4 oz (126.962 kg)     Radiology/Studies:  Ct Angio Chest Pe W/cm &/or Wo Cm  04/21/2015   CLINICAL DATA:  Progressive dyspnea 1-2 weeks worse today with  tachycardia. Smoker.  EXAM: CT ANGIOGRAPHY CHEST WITH CONTRAST  TECHNIQUE: Multidetector CT imaging of the chest was performed using the standard protocol during bolus administration of intravenous contrast. Multiplanar CT image reconstructions and MIPs were obtained to evaluate the vascular anatomy.  CONTRAST:  29mL OMNIPAQUE IOHEXOL 350 MG/ML SOLN  COMPARISON:  Chest x-ray today.  FINDINGS: Lungs are adequately inflated without focal consolidation or effusion. There is minimal linear atelectasis/scarring over the lingula. There is a 5-6 mm nodular density over the lingula. Airways are within normal.  There is mild cardiomegaly. There is no evidence of pulmonary embolism. There is no significant hilar adenopathy. There is a 1.3 cm precarinal lymph node likely reactive. Ascending thoracic aorta measures 3.7 cm in AP diameter. No axillary adenopathy. Remaining mediastinal structures are unremarkable.  Limited images through the upper abdomen are within normal. There are mild degenerate changes of the spine.  Review of the MIP images confirms the above findings.  IMPRESSION: No acute cardiopulmonary disease and no evidence of pulmonary embolism.  5-6 mm nodule over the lingula. Recommend followup chest CT 6 months. This recommendation follows the consensus statement: Guidelines for Management of Small Pulmonary Nodules Detected on CT Scans: A Statement from the [employer] as published in Radiology 2005; 237:395-400. Online at: https://www.arnold.com/.  1.3 cm precarinal lymph node likely reactive.  Mild cardiomegaly. 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 Recommend annual imaging followup by CTA or MRA. This recommendation follows 2010 ACCF/AHA/AATS/ACR/ASA/SCA/SCAI/SIR/STS/SVM Guidelines for the Diagnosis and Management of Patients with Thoracic Aortic Disease. Circulation.2010; 121: Z308-M578.   Electronically Signed   By: Marin Olp  M.D.   On: 04/21/2015 20:23   US Venous Img Lower Unilateral Right  04/23/2015   CLINICAL DATA:  Swelling for 1 month  EXAM: RIGHT LOWER EXTREMITY VENOUS DUPLEX ULTRASOUND  TECHNIQUE: Doppler venous assessment of the left lower extremity deep venous system was performed, including characterization of spectral flow, compressibility, and phasicity.  COMPARISON:  None.  FINDINGS: There is complete compressibility of the right common femoral, femoral, and popliteal veins. Doppler analysis demonstrates respiratory phasicity and augmentation of flow after calf compression. No evidence of calf vein DVT.  IMPRESSION: No evidence of right lower extremity DVT.   Electronically Signed   By: Marybelle Killings M.D.   On: 04/23/2015 15:24   Dg Chest Port 1 View  04/21/2015   CLINICAL DATA:  Worsening shortness of breath since yesterday.  EXAM: PORTABLE CHEST - 1 VIEW  COMPARISON:  09/08/2008  FINDINGS: Lungs are adequately inflated with minimal prominence of the perihilar markings. No evidence of effusion or focal consolidation. Cardiomediastinal silhouette and remainder of the exam is within normal.  IMPRESSION: Evidence of mild vascular congestion.   Electronically Signed   By: Marin Olp M.D.   On: 04/21/2015 18:14     Assessment and Plan  69 y.o. female  with h/o DM2, HTN, HLD, ongoing tobacco abuse, and obesity who presented to [facility] on 8/29 with 2 week history of increasing exertional dyspnea, now at rest as well, and was found to be in new onset Afib with RVR with rates into the 140's.  1. New onset Afib/flutter with RVR: -Currently rate controlled in the 70s in atrial flutter -Has been experiencing increased dyspnea over the past 2 weeks, initially with exertion only, now at rest as well -High probably that she was in Afib/flutter at that time -Will need to continue rate control as she was not on full dose anticoagulation upon admission  -Continue digoxin 0.25 mg    -Continue diltiazem 60 mg q 6 hours and  metoprolol 25 bid -Lisinopril/HCTZ have been held to allow for more room for rate control -Possibly in the setting of hypokalemia  -Will need ischemic evaluation by nuclear stress testing as outpatient  - -CHADSVASc of 4 (HTN, diabetes, age x 1, female), giving her an estimated annual stroke risk of 4.0% --Start Eliquis 5 mg bid, and discontinue heparin gtt after 1st dose of Eliquis  2. Increased dyspnea/mild tachy-mediated cardiomyopathy: -In the setting of Afib/flutter with RVR -Currently rate controlled -Continue PO Lasix 20 mg daily -Patient to ambulate today to assess her breathing and heart rate, if she does not tolerate either of these she will require a TEE/DCCV prior to discharge. The earliest this can be done would be 9/2, will go ahead and place her on the schedule and cancel if needed -Likely holding on to excess volume in the setting of her new onset Afib/flutter with RVR -Echo as above  3. Hypokalemia: -Likely in the setting of HCTZ -Replete to 4.0  4. HTN: -Stable, somewhat soft -Continue current medications as above  5. HLD: -Continue pravastatin   6. Ongoing tobacco abuse: -Cessation advised    Signed, Christell Faith, PA-C Pager: [phone] 04/24/2015, 11:16 AM   I have seen, examined and evaluated the patient this AM along with Mr. Idolina Primer, [place] on AM rounds.  After reviewing all the available data and chart,  I agree with his findings, examination as well as impression recommendations as discussed on rounds.  The patient remains in atrial flutter, albeit much better rate control. Despite this she is still somewhat symptomatic. She had an episode of orthopnea and dyspnea this morning reportedly was treated with Lasix. She probably has some diastolic dysfunction and heart failure mediated by her atrial flutter with lack of atrial kick.  At this point, with aerating adequately controlled the question is in the warehouse how symptomatic she is with rate control  versus restoration of sinus rhythm. I would like to monitor her today in activity and then overnight and see if she has any more episodes of dyspnea. If not, could consider discharge with the plan of continuing anticoagulation (ELIQUIS) for 3-4 weeks and consider cardioversion as an outpatient versus TEE guided cardioversion likely on Friday if she is symptomatic.  We'll start of the importance of eventually undergoing ischemic evaluation with a Myoview stress test -- this is likely better done as an outpatient once she is out of atrial flutter..  I would start standing dose of diuretic.    Leonie Man, M.D., M.S. Interventional Cardiologist   Pager # [phone]

## 2015-04-24 NOTE — Progress Notes (Addendum)
[place] at [facility]                                                                                                                                                                                            Patient Demographics   Paula Goodman, is a 69 y.o. female, DOB - 11/08/1945, OAC:[id number]  Admit date - 04/21/2015   Admitting Physician Baxter Hire, MD  Outpatient Primary MD for the patient is Sherrin Daisy, MD   LOS - 3  Subjective:  Heart rate improved shortness of breath is improved a cardiogram with systolic dysfunction    Review of Systems:   CONSTITUTIONAL: No documented fever. No fatigue, weakness. No weight gain, no weight loss.  EYES: No blurry or double vision.  ENT: No tinnitus. No postnasal drip. No redness of the oropharynx.  RESPIRATORY: No cough, no wheeze, no hemoptysis. Positive dyspnea.  CARDIOVASCULAR: No chest pain. No orthopnea. Positive palpitations. No syncope.  GASTROINTESTINAL: No nausea, no vomiting or diarrhea. No abdominal pain. No melena or hematochezia.  GENITOURINARY: No dysuria or hematuria.  ENDOCRINE: No polyuria or nocturia. No heat or cold intolerance.  HEMATOLOGY: No anemia. No bruising. No bleeding.  INTEGUMENTARY: No rashes. No lesions.  MUSCULOSKELETAL: No arthritis. No swelling. No gout.  NEUROLOGIC: No numbness, tingling, or ataxia. No seizure-type activity.  PSYCHIATRIC: No anxiety. No insomnia. No ADD.    Vitals:   Filed Vitals:   04/23/15 2353 04/24/15 0453 04/24/15 1102 04/24/15 1106  BP: 107/55 117/74 112/84   Pulse: 76 70 75 77  Temp:  97.9 F (36.6 C) 97.7 F (36.5 C)   TempSrc:  Oral Oral   Resp:  18 20   Height:      Weight:  126.962 kg (279 lb 14.4 oz)    SpO2:  94% 97%     Wt Readings from Last 3 Encounters:  04/24/15 126.962 kg (279 lb 14.4 oz)     Intake/Output Summary (Last 24 hours) at 04/24/15 1304 Last data filed at 04/24/15 0900  Gross per 24 hour   Intake    628 ml  Output   2901 ml  Net  -2273 ml    Physical Exam:   GENERAL: Pleasant-appearing in no apparent distress.  HEAD, EYES, EARS, NOSE AND THROAT: Atraumatic, normocephalic. Extraocular muscles are intact. Pupils equal and reactive to light. Sclerae anicteric. No conjunctival injection. No oro-pharyngeal erythema.  NECK: Supple. There is no jugular venous distention. No bruits, no lymphadenopathy, no thyromegaly.  HEART: Irregularly irregular rate and rhythm,. No murmurs, no rubs, no clicks.  LUNGS: Clear to auscultation bilaterally. No  rales or rhonchi. No wheezes.  ABDOMEN: Soft, flat, nontender, nondistended. Has good bowel sounds. No hepatosplenomegaly appreciated.  EXTREMITIES: No evidence of any cyanosis, clubbing, or peripheral edema.  +2 pedal and radial pulses bilaterally.  NEUROLOGIC: The patient is alert, awake, and oriented x3 with no focal motor or sensory deficits appreciated bilaterally.  SKIN: Moist and warm with no rashes appreciated.  Psych: Not anxious, depressed LN: No inguinal LN enlargement    Antibiotics   Anti-infectives    None      Medications   Scheduled Meds: . apixaban  5 mg Oral BID  . digoxin  0.25 mg Oral Daily  . diltiazem  60 mg Oral 4 times per day  . furosemide  20 mg Oral Daily  . gabapentin  300 mg Oral QHS  . glimepiride  2 mg Oral Daily  . metoprolol tartrate  25 mg Oral 3 times per day  . pantoprazole  40 mg Oral Daily  . potassium chloride  40 mEq Oral BID  . pravastatin  10 mg Oral q1800  . [START ON 04/26/2015] sodium chloride  3 mL Intravenous Q12H   Continuous Infusions: . [START ON 04/26/2015] sodium chloride    . [START ON 04/26/2015] sodium chloride     PRN Meds:.acetaminophen **OR** acetaminophen, diltiazem   Data Review:   Micro Results Recent Results (from the past 240 hour(s))  MRSA PCR Screening     Status: None   Collection Time: 04/21/15 11:10 PM  Result Value Ref Range Status   MRSA by PCR  NEGATIVE NEGATIVE Final    Comment:        The GeneXpert MRSA Assay (FDA approved for NASAL specimens only), is one component of a comprehensive MRSA colonization surveillance program. It is not intended to diagnose MRSA infection nor to guide or monitor treatment for MRSA infections.     Radiology Reports Ct Angio Chest Pe W/cm &/or Wo Cm  04/21/2015   CLINICAL DATA:  Progressive dyspnea 1-2 weeks worse today with tachycardia. Smoker.  EXAM: CT ANGIOGRAPHY CHEST WITH CONTRAST  TECHNIQUE: Multidetector CT imaging of the chest was performed using the standard protocol during bolus administration of intravenous contrast. Multiplanar CT image reconstructions and MIPs were obtained to evaluate the vascular anatomy.  CONTRAST:  87mL OMNIPAQUE IOHEXOL 350 MG/ML SOLN  COMPARISON:  Chest x-ray today.  FINDINGS: Lungs are adequately inflated without focal consolidation or effusion. There is minimal linear atelectasis/scarring over the lingula. There is a 5-6 mm nodular density over the lingula. Airways are within normal.  There is mild cardiomegaly. There is no evidence of pulmonary embolism. There is no significant hilar adenopathy. There is a 1.3 cm precarinal lymph node likely reactive. Ascending thoracic aorta measures 3.7 cm in AP diameter. No axillary adenopathy. Remaining mediastinal structures are unremarkable.  Limited images through the upper abdomen are within normal. There are mild degenerate changes of the spine.  Review of the MIP images confirms the above findings.  IMPRESSION: No acute cardiopulmonary disease and no evidence of pulmonary embolism.  5-6 mm nodule over the lingula. Recommend followup chest CT 6 months. This recommendation follows the consensus statement: Guidelines for Management of Small Pulmonary Nodules Detected on CT Scans: A Statement from the [place] as published in Radiology 2005; 237:395-400. Online at: https://www.arnold.com/.   1.3 cm precarinal lymph node likely reactive.  Mild cardiomegaly. 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 Recommend annual imaging followup by CTA or MRA. This recommendation  follows 2010 ACCF/AHA/AATS/ACR/ASA/SCA/SCAI/SIR/STS/SVM Guidelines for the Diagnosis and Management of Patients with Thoracic Aortic Disease. Circulation.2010; 121: V784-O962.   Electronically Signed   By: Marin Olp M.D.   On: 04/21/2015 20:23   US Venous Img Lower Unilateral Right  04/23/2015   CLINICAL DATA:  Swelling for 1 month  EXAM: RIGHT LOWER EXTREMITY VENOUS DUPLEX ULTRASOUND  TECHNIQUE: Doppler venous assessment of the left lower extremity deep venous system was performed, including characterization of spectral flow, compressibility, and phasicity.  COMPARISON:  None.  FINDINGS: There is complete compressibility of the right common femoral, femoral, and popliteal veins. Doppler analysis demonstrates respiratory phasicity and augmentation of flow after calf compression. No evidence of calf vein DVT.  IMPRESSION: No evidence of right lower extremity DVT.   Electronically Signed   By: Marybelle Killings M.D.   On: 04/23/2015 15:24   Dg Chest Port 1 View  04/21/2015   CLINICAL DATA:  Worsening shortness of breath since yesterday.  EXAM: PORTABLE CHEST - 1 VIEW  COMPARISON:  09/08/2008  FINDINGS: Lungs are adequately inflated with minimal prominence of the perihilar markings. No evidence of effusion or focal consolidation. Cardiomediastinal silhouette and remainder of the exam is within normal.  IMPRESSION: Evidence of mild vascular congestion.   Electronically Signed   By: Marin Olp M.D.   On: 04/21/2015 18:14     CBC  Recent Labs Lab 04/21/15 1804 04/22/15 0813 04/23/15 0558 04/24/15 0515  WBC 9.5 7.7 8.1 9.5  HGB 13.3 12.4 12.9 12.8  HCT 40.6 37.6 38.7 40.1  PLT 235 200 194 206  MCV 91.5 91.4 92.4 92.3  MCH 30.0 30.1 30.8 29.5  MCHC 32.8 32.9 33.4 32.0  RDW 14.9* 14.9*  15.1* 15.5*    Chemistries   Recent Labs Lab 04/21/15 1804 04/22/15 0544 04/22/15 1256 04/23/15 0558 04/24/15 0514  NA 141 142 140 140 143  K 3.2* 3.0* 3.3* 3.4* 3.8  CL 103 106 104 105 107  CO2 26 29 28 28 28   GLUCOSE 114* 114* 172* 129* 139*  BUN 14 11 10 9 12   CREATININE 1.12* 0.93 0.90 0.86 0.97  CALCIUM 9.1 8.8* 9.0 9.2 9.0  MG  --   --   --   --  2.0  AST 31  --   --   --   --   ALT 28  --   --   --   --   ALKPHOS 71  --   --   --   --   BILITOT 0.5  --   --   --   --    ------------------------------------------------------------------------------------------------------------------ estimated creatinine clearance is 78 mL/min (by C-G formula based on Cr of 0.97). ------------------------------------------------------------------------------------------------------------------ No results for input(s): HGBA1C in the last 72 hours. ------------------------------------------------------------------------------------------------------------------ No results for input(s): CHOL, HDL, LDLCALC, TRIG, CHOLHDL, LDLDIRECT in the last 72 hours. ------------------------------------------------------------------------------------------------------------------  Recent Labs  04/22/15 1256  TSH 0.807   ------------------------------------------------------------------------------------------------------------------ No results for input(s): VITAMINB12, FOLATE, FERRITIN, TIBC, IRON, RETICCTPCT in the last 72 hours.  Coagulation profile  Recent Labs Lab 04/21/15 1804  INR 1.02    No results for input(s): DDIMER in the last 72 hours.  Cardiac Enzymes  Recent Labs Lab 04/21/15 2352 04/22/15 0544 04/22/15 1103  TROPONINI <0.03 <0.03 <0.03   ------------------------------------------------------------------------------------------------------------------ Invalid input(s): POCBNP    Assessment & Plan   1.  Atrial Fibrilation with RVR: Continue digoxin and metoprolol  patient's heparin will be discontinued and she started on Eliquis  2. Shortness of  Breath: may be due to atrial fibCT scan per PE  negative for any pulmonary pathology, on by mouth Lasix  3. HTN Continue Cardizem and discontinue other blood pressure meds due to blood pressure being low   4. DM: Contorlled with home meds.Sliding scale insulin and metformin on hold due to receiving contrast  5. Right lower extremity swelling negative for DVT    Code Status Orders        Start     Ordered   04/21/15 2313  Full code   Continuous     04/21/15 2312           Consults cardiology   DVT Prophylaxis  heparin   Lab Results  Component Value Date   PLT 206 04/24/2015     Time Spent in minutes  35 minutes     Dustin Flock M.D on 04/24/2015 at 1:04 PM  Between 7am to 6pm - Pager - [phone]  After 6pm go to [email or web address] - password [email or web address] [facility]   Office  [phone]

## 2015-04-24 NOTE — Progress Notes (Signed)
Pt called with complaints she was having difficulty breathing. Upon assessment, her O2 sats were WNL but her HR was elevated. IV cardizem given per PRN orders/parameters.  Pt also complaining of her abdomen feeling "full". Dr. Lavetta Nielsen notified, IV lasix order. Pt placed on O2. Pt condition improving will continue to notify.

## 2015-04-25 ENCOUNTER — Telehealth: Payer: Self-pay

## 2015-04-25 LAB — GLUCOSE, CAPILLARY
GLUCOSE-CAPILLARY: 158 mg/dL — AB (ref 65–99)
GLUCOSE-CAPILLARY: 141 mg/dL — AB (ref 65–99)
Glucose-Capillary: 104 mg/dL — ABNORMAL HIGH (ref 65–99)
Glucose-Capillary: 169 mg/dL — ABNORMAL HIGH (ref 65–99)

## 2015-04-25 MED ORDER — FUROSEMIDE 20 MG PO TABS: 20.0000 mg | ORAL_TABLET | Freq: Every day | ORAL | Status: DC

## 2015-04-25 MED ORDER — SODIUM CHLORIDE 0.9 % IJ SOLN
3.0000 mL | Freq: Two times a day (BID) | INTRAMUSCULAR | Status: DC
  Administered 2015-04-25 – 2015-04-26 (×3): 3 mL via INTRAVENOUS

## 2015-04-25 MED ORDER — METOPROLOL TARTRATE 25 MG PO TABS: 25.0000 mg | ORAL_TABLET | Freq: Two times a day (BID) | ORAL | Status: DC

## 2015-04-25 MED ORDER — SODIUM CHLORIDE 0.9 % IJ SOLN: 3.0000 mL | INTRAMUSCULAR | Status: DC | PRN

## 2015-04-25 MED ORDER — DILTIAZEM HCL ER COATED BEADS 120 MG PO TB24: 120.0000 mg | ORAL_TABLET | Freq: Every day | ORAL | Status: DC

## 2015-04-25 MED ORDER — DIGOXIN 250 MCG PO TABS: 0.2500 mg | ORAL_TABLET | Freq: Every day | ORAL | Status: DC

## 2015-04-25 MED ORDER — APIXABAN 5 MG PO TABS: 5.0000 mg | ORAL_TABLET | Freq: Two times a day (BID) | ORAL | Status: DC

## 2015-04-25 MED ORDER — SODIUM CHLORIDE 0.9 % IV SOLN: 250.0000 mL | INTRAVENOUS | Status: DC

## 2015-04-25 NOTE — Progress Notes (Signed)
Patient: Paula Goodman / Admit Date: 04/21/2015 / Date of Encounter: 04/25/2015, 11:43 AM   Subjective:  Heart rate has improved to the 70's. In atrial flutter. Echo showed EF 45-50%, normal wall motion, mild MR, left atrium was mildly dilated at  46 mm, PASP normal.  Heart rate is controlled at rest but not with activities She just walked out of the bathroom with significant dyspnea and a heart rate of 115 bpm.  Review of Systems: Review of Systems  Constitutional: Positive for malaise/fatigue. Negative for fever, chills, weight loss and diaphoresis.  HENT: Negative for congestion.   Eyes: Negative for discharge and redness.  Respiratory: Positive for shortness of breath. Negative for cough, hemoptysis, sputum production and wheezing.   Cardiovascular: Positive for leg swelling and PND. Negative for chest pain, palpitations, orthopnea and claudication.       RLE>LLE  Gastrointestinal: Negative for heartburn, nausea, vomiting, blood in stool and melena.  Musculoskeletal: Negative for falls.  Skin: Negative for rash.  Neurological: Positive for weakness. Negative for sensory change, speech change and focal weakness.  Endo/Heme/Allergies: Does not bruise/bleed easily.  Psychiatric/Behavioral: The patient is nervous/anxious.   All other systems reviewed and are negative.   Objective: Telemetry: Atrial flutter, 70's Physical Exam: Blood pressure 122/75, pulse 72, temperature 98.2 F (36.8 C), temperature source Oral, resp. rate 20, height 5\' 7"  (1.702 m), weight 279 lb 14.4 oz (126.962 kg), SpO2 96 %. Body mass index is 43.83 kg/(m^2). General: Well developed, well nourished, in no acute distress. Head: Normocephalic, atraumatic, sclera non-icteric, no xanthomas, nares are without discharge. Neck: Negative for carotid bruits. JVP not elevated. Lungs: Clear bilaterally to auscultation without wheezes, rales, or rhonchi. Breathing is unlabored. Heart: Irregular, S1 S2 without  murmurs, rubs, or gallops.  Abdomen: Obese, soft, non-tender, non-distended with normoactive bowel sounds. No rebound/guarding. Extremities: No clubbing or cyanosis. No edema. Distal pedal pulses are 2+ and equal bilaterally. Neuro: Alert and oriented X 3. Moves all extremities spontaneously. Psych:  Responds to questions appropriately with a normal affect.   Intake/Output Summary (Last 24 hours) at 04/25/15 1143 Last data filed at 04/25/15 0507  Gross per 24 hour  Intake      0 ml  Output    200 ml  Net   -200 ml    Inpatient Medications:  . apixaban  5 mg Oral BID  . digoxin  0.25 mg Oral Daily  . diltiazem  60 mg Oral 4 times per day  . furosemide  20 mg Oral Daily  . gabapentin  300 mg Oral QHS  . glimepiride  2 mg Oral Daily  . insulin aspart  0-9 Units Subcutaneous TID WC  . metoprolol tartrate  25 mg Oral 3 times per day  . metoprolol tartrate  25 mg Oral BID  . pantoprazole  40 mg Oral Daily  . potassium chloride  40 mEq Oral BID  . pravastatin  10 mg Oral q1800  . [START ON 04/26/2015] sodium chloride  3 mL Intravenous Q12H   Infusions:  . [START ON 04/26/2015] sodium chloride    . [START ON 04/26/2015] sodium chloride      Labs:  Recent Labs  04/23/15 0558 04/24/15 0514  NA 140 143  K 3.4* 3.8  CL 105 107  CO2 28 28  GLUCOSE 129* 139*  BUN 9 12  CREATININE 0.86 0.97  CALCIUM 9.2 9.0  MG  --  2.0   No results for input(s): AST, ALT, ALKPHOS, BILITOT,  PROT, ALBUMIN in the last 72 hours.  Recent Labs  04/23/15 0558 04/24/15 0515  WBC 8.1 9.5  HGB 12.9 12.8  HCT 38.7 40.1  MCV 92.4 92.3  PLT 194 206   No results for input(s): CKTOTAL, CKMB, TROPONINI in the last 72 hours. Invalid input(s): POCBNP No results for input(s): HGBA1C in the last 72 hours.   Weights: Filed Weights   04/21/15 1951 04/24/15 0453  Weight: 287 lb 6.4 oz (130.364 kg) 279 lb 14.4 oz (126.962 kg)     Radiology/Studies:  Ct Angio Chest Pe W/cm &/or Wo Cm  04/21/2015    CLINICAL DATA:  Progressive dyspnea 1-2 weeks worse today with tachycardia. Smoker.  EXAM: CT ANGIOGRAPHY CHEST WITH CONTRAST  TECHNIQUE: Multidetector CT imaging of the chest was performed using the standard protocol during bolus administration of intravenous contrast. Multiplanar CT image reconstructions and MIPs were obtained to evaluate the vascular anatomy.  CONTRAST:  77mL OMNIPAQUE IOHEXOL 350 MG/ML SOLN  COMPARISON:  Chest x-ray today.  FINDINGS: Lungs are adequately inflated without focal consolidation or effusion. There is minimal linear atelectasis/scarring over the lingula. There is a 5-6 mm nodular density over the lingula. Airways are within normal.  There is mild cardiomegaly. There is no evidence of pulmonary embolism. There is no significant hilar adenopathy. There is a 1.3 cm precarinal lymph node likely reactive. Ascending thoracic aorta measures 3.7 cm in AP diameter. No axillary adenopathy. Remaining mediastinal structures are unremarkable.  Limited images through the upper abdomen are within normal. There are mild degenerate changes of the spine.  Review of the MIP images confirms the above findings.  IMPRESSION: No acute cardiopulmonary disease and no evidence of pulmonary embolism.  5-6 mm nodule over the lingula. Recommend followup chest CT 6 months. This recommendation follows the consensus statement: Guidelines for Management of Small Pulmonary Nodules Detected on CT Scans: A Statement from the [place] as published in Radiology 2005; 237:395-400. Online at: https://www.arnold.com/.  1.3 cm precarinal lymph node likely reactive.  Mild cardiomegaly. 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 Recommend annual imaging followup by CTA or MRA. This recommendation follows 2010 ACCF/AHA/AATS/ACR/ASA/SCA/SCAI/SIR/STS/SVM Guidelines for the Diagnosis and Management of Patients with Thoracic Aortic Disease. Circulation.2010;  121: Q300-P233.   Electronically Signed   By: Marin Olp M.D.   On: 04/21/2015 20:23   US Venous Img Lower Unilateral Right  04/23/2015   CLINICAL DATA:  Swelling for 1 month  EXAM: RIGHT LOWER EXTREMITY VENOUS DUPLEX ULTRASOUND  TECHNIQUE: Doppler venous assessment of the left lower extremity deep venous system was performed, including characterization of spectral flow, compressibility, and phasicity.  COMPARISON:  None.  FINDINGS: There is complete compressibility of the right common femoral, femoral, and popliteal veins. Doppler analysis demonstrates respiratory phasicity and augmentation of flow after calf compression. No evidence of calf vein DVT.  IMPRESSION: No evidence of right lower extremity DVT.   Electronically Signed   By: Marybelle Killings M.D.   On: 04/23/2015 15:24   Dg Chest Port 1 View  04/21/2015   CLINICAL DATA:  Worsening shortness of breath since yesterday.  EXAM: PORTABLE CHEST - 1 VIEW  COMPARISON:  09/08/2008  FINDINGS: Lungs are adequately inflated with minimal prominence of the perihilar markings. No evidence of effusion or focal consolidation. Cardiomediastinal silhouette and remainder of the exam is within normal.  IMPRESSION: Evidence of mild vascular congestion.   Electronically Signed   By: Marin Olp M.D.   On: 04/21/2015  18:14     Assessment and Plan  69 y.o. female with h/o DM2, HTN, HLD, ongoing tobacco abuse, and obesity who presented to [facility] on 8/29 with 2 week history of increasing exertional dyspnea, now at rest as well, and was found to be in new onset Afib with RVR with rates into the 140's.  1. New onset Afib/flutter with RVR: -Currently rate controlled in the 70s in atrial flutter. However, ventricular rate is not controlled with physical activities and she is very symptomatic from this. -Continue digoxin 0.25 mg    -Continue diltiazem 60 mg q 6 hours and metoprolol 25 bid -Lisinopril/HCTZ have been held to allow for more room for rate control -Possibly  in the setting of hypokalemia  - Recommend proceeding with TEE guided cardioversion. I discussed the procedure in details as well as risks and benefits. - -CHADSVASc of 4 (HTN, diabetes, age x 1, female), giving her an estimated annual stroke risk of 4.0% --continue Eliquis 5 mg bid. Recommend outpatient EP evaluation for ablation to eliminate the need for anticoagulation.  2. Increased dyspnea/mild tachy-mediated cardiomyopathy: -In the setting of Afib/flutter with RVR -Currently rate controlled -Continue PO Lasix 20 mg daily - Recommend outpatient nuclear stress test.    Signed, Kathlyn Sacramento, MD  04/25/2015, 11:43 AM

## 2015-04-25 NOTE — Discharge Instructions (Signed)
DIET:  Cardiac diet, diabetic diet  DISCHARGE CONDITION:  Stable  ACTIVITY:  Activity as tolerated  OXYGEN:  Home Oxygen: No.   Oxygen Delivery: room air  DISCHARGE LOCATION:  home    ADDITIONAL DISCHARGE INSTRUCTION:   If you experience worsening of your admission symptoms, develop shortness of breath, life threatening emergency, suicidal or homicidal thoughts you must seek medical attention immediately by calling 911 or calling your MD immediately  if symptoms less severe.  You Must read complete instructions/literature along with all the possible adverse reactions/side effects for all the Medicines you take and that have been prescribed to you. Take any new Medicines after you have completely understood and accpet all the possible adverse reactions/side effects.   Please note  You were cared for by a hospitalist during your hospital stay. If you have any questions about your discharge medications or the care you received while you were in the hospital after you are discharged, you can call the unit and asked to speak with the hospitalist on call if the hospitalist that took care of you is not available. Once you are discharged, your primary care physician will handle any further medical issues. Please note that NO REFILLS for any discharge medications will be authorized once you are discharged, as it is imperative that you return to your primary care physician (or establish a relationship with a primary care physician if you do not have one) for your aftercare needs so that they can reassess your need for medications and monitor your lab values.  Information on my medicine - ELIQUIS (apixaban)  This medication education was reviewed with me or my healthcare representative as part of my discharge preparation.  The pharmacist that spoke with me during my hospital stay was:  Cheri Guppy, [facility]  Why was Eliquis prescribed for you? Eliquis was prescribed for you to reduce the  risk of a blood clot forming that can cause a stroke if you have a medical condition called atrial fibrillation (a type of irregular heartbeat).  What do You need to know about Eliquis ? Take your Eliquis TWICE DAILY - one tablet in the morning and one tablet in the evening with or without food. If you have difficulty swallowing the tablet whole please discuss with your pharmacist how to take the medication safely.  Take Eliquis exactly as prescribed by your doctor and DO NOT stop taking Eliquis without talking to the doctor who prescribed the medication.  Stopping may increase your risk of developing a stroke.  Refill your prescription before you run out.  After discharge, you should have regular check-up appointments with your healthcare provider that is prescribing your Eliquis.  In the future your dose may need to be changed if your kidney function or weight changes by a significant amount or as you get older.  What do you do if you miss a dose? If you miss a dose, take it as soon as you remember on the same day and resume taking twice daily.  Do not take more than one dose of ELIQUIS at the same time to make up a missed dose.  Important Safety Information A possible side effect of Eliquis is bleeding. You should call your healthcare provider right away if you experience any of the following: ? Bleeding from an injury or your nose that does not stop. ? Unusual colored urine (red or dark brown) or unusual colored stools (red or black). ? Unusual bruising for unknown reasons. ? A serious fall or  if you hit your head (even if there is no bleeding).  Some medicines may interact with Eliquis and might increase your risk of bleeding or clotting while on Eliquis. To help avoid this, consult your healthcare provider or pharmacist prior to using any new prescription or non-prescription medications, including herbals, vitamins, non-steroidal anti-inflammatory drugs (NSAIDs) and  supplements.  This website has more information on Eliquis (apixaban): http://www.eliquis.com/eliquis/home

## 2015-04-25 NOTE — Telephone Encounter (Signed)
-----   Message from Blain Pais sent at 04/25/2015 11:22 AM EDT ----- Regarding: tcm/ph 05/07/2015 3:00 Murray Hodgkins, NP

## 2015-04-25 NOTE — Progress Notes (Signed)
Patient signed consent form; placed in ghost chart.

## 2015-04-25 NOTE — Care Management Important Message (Signed)
Important Message  Patient Details  Name: Paula Goodman MRN: [id number] Date of Birth: 1946-01-15   Medicare Important Message Given:  Yes-third notification given    Juliann Pulse A Allmond 04/25/2015, 10:46 AM

## 2015-04-25 NOTE — Telephone Encounter (Signed)
Spoke w/ pt.  She states that she is still in the hospital and did not know that she is being discharged yet.

## 2015-04-25 NOTE — Progress Notes (Addendum)
Paged Paula Faith, PA regarding TEE order that specifies 5 consec. doses of eliquis need to be taken prior to procedure tomorrow. Patient has only taken 3 doses so far. Thurmond Butts is aware and said patient is okay to continue with procedure tomorrow.   Update: 12:49 PM Patient will have had 5 doses of Eliquis by the time she goes down for her TEE with cardioversion tomorrow AM.

## 2015-04-25 NOTE — Care Management (Signed)
Patient is to have TEE 9/2.  Will anticipate need for Eliquis.  Made attempted today to obtain insurance benefits for the drug but unable to maintain a connection.  Will attempt again 9/2

## 2015-04-25 NOTE — Progress Notes (Signed)
[place] at [facility]                                                                                                                                                                                            Patient Demographics   Paula Goodman, is a 69 y.o. female, DOB - Nov 07, 1945, ALP:[id number]  Admit date - 04/21/2015   Admitting Physician Baxter Hire, MD  Outpatient Primary MD for the patient is Sherrin Daisy, MD   LOS - 4  Subjective:  Patient still having shortness of breath with exertion heart rate becomes elevated with ambulation plan for TEE tomorrowg per cardiology   Review of Systems:   CONSTITUTIONAL: No documented fever. No fatigue, weakness. No weight gain, no weight loss.  EYES: No blurry or double vision.  ENT: No tinnitus. No postnasal drip. No redness of the oropharynx.  RESPIRATORY: No cough, no wheeze, no hemoptysis. Positive dyspnea.  CARDIOVASCULAR: No chest pain. No orthopnea. Positive palpitations. No syncope.  GASTROINTESTINAL: No nausea, no vomiting or diarrhea. No abdominal pain. No melena or hematochezia.  GENITOURINARY: No dysuria or hematuria.  ENDOCRINE: No polyuria or nocturia. No heat or cold intolerance.  HEMATOLOGY: No anemia. No bruising. No bleeding.  INTEGUMENTARY: No rashes. No lesions.  MUSCULOSKELETAL: No arthritis. No swelling. No gout.  NEUROLOGIC: No numbness, tingling, or ataxia. No seizure-type activity.  PSYCHIATRIC: No anxiety. No insomnia. No ADD.    Vitals:   Filed Vitals:   04/25/15 0503 04/25/15 0752 04/25/15 0754 04/25/15 1150  BP: 106/56 122/75  108/76  Pulse: 67 70 72 80  Temp: 98.2 F (36.8 C)   97.5 F (36.4 C)  TempSrc: Oral   Oral  Resp: 20   17  Height:      Weight:      SpO2: 96%   91%    Wt Readings from Last 3 Encounters:  04/24/15 126.962 kg (279 lb 14.4 oz)     Intake/Output Summary (Last 24 hours) at 04/25/15 1333 Last data filed at 04/25/15 0507  Gross  per 24 hour  Intake      0 ml  Output    200 ml  Net   -200 ml    Physical Exam:   GENERAL: Pleasant-appearing in no apparent distress.  HEAD, EYES, EARS, NOSE AND THROAT: Atraumatic, normocephalic. Extraocular muscles are intact. Pupils equal and reactive to light. Sclerae anicteric. No conjunctival injection. No oro-pharyngeal erythema.  NECK: Supple. There is no jugular venous distention. No bruits, no lymphadenopathy, no thyromegaly.  HEART: Irregularly irregular rate and rhythm,. No murmurs, no rubs, no clicks.  LUNGS:  Clear to auscultation bilaterally. No rales or rhonchi. No wheezes.  ABDOMEN: Soft, flat, nontender, nondistended. Has good bowel sounds. No hepatosplenomegaly appreciated.  EXTREMITIES: No evidence of any cyanosis, clubbing, or peripheral edema.  +2 pedal and radial pulses bilaterally.  NEUROLOGIC: The patient is alert, awake, and oriented x3 with no focal motor or sensory deficits appreciated bilaterally.  SKIN: Moist and warm with no rashes appreciated.  Psych: Not anxious, depressed LN: No inguinal LN enlargement    Antibiotics   Anti-infectives    None      Medications   Scheduled Meds: . apixaban  5 mg Oral BID  . digoxin  0.25 mg Oral Daily  . diltiazem  60 mg Oral 4 times per day  . furosemide  20 mg Oral Daily  . gabapentin  300 mg Oral QHS  . glimepiride  2 mg Oral Daily  . insulin aspart  0-9 Units Subcutaneous TID WC  . metoprolol tartrate  25 mg Oral 3 times per day  . metoprolol tartrate  25 mg Oral BID  . pantoprazole  40 mg Oral Daily  . potassium chloride  40 mEq Oral BID  . pravastatin  10 mg Oral q1800  . [START ON 04/26/2015] sodium chloride  3 mL Intravenous Q12H  . sodium chloride  3 mL Intravenous Q12H   Continuous Infusions: . [START ON 04/26/2015] sodium chloride    . [START ON 04/26/2015] sodium chloride    . sodium chloride     PRN Meds:.acetaminophen **OR** acetaminophen, diltiazem, sodium chloride   Data Review:    Micro Results Recent Results (from the past 240 hour(s))  MRSA PCR Screening     Status: None   Collection Time: 04/21/15 11:10 PM  Result Value Ref Range Status   MRSA by PCR NEGATIVE NEGATIVE Final    Comment:        The GeneXpert MRSA Assay (FDA approved for NASAL specimens only), is one component of a comprehensive MRSA colonization surveillance program. It is not intended to diagnose MRSA infection nor to guide or monitor treatment for MRSA infections.     Radiology Reports Ct Angio Chest Pe W/cm &/or Wo Cm  04/21/2015   CLINICAL DATA:  Progressive dyspnea 1-2 weeks worse today with tachycardia. Smoker.  EXAM: CT ANGIOGRAPHY CHEST WITH CONTRAST  TECHNIQUE: Multidetector CT imaging of the chest was performed using the standard protocol during bolus administration of intravenous contrast. Multiplanar CT image reconstructions and MIPs were obtained to evaluate the vascular anatomy.  CONTRAST:  83mL OMNIPAQUE IOHEXOL 350 MG/ML SOLN  COMPARISON:  Chest x-ray today.  FINDINGS: Lungs are adequately inflated without focal consolidation or effusion. There is minimal linear atelectasis/scarring over the lingula. There is a 5-6 mm nodular density over the lingula. Airways are within normal.  There is mild cardiomegaly. There is no evidence of pulmonary embolism. There is no significant hilar adenopathy. There is a 1.3 cm precarinal lymph node likely reactive. Ascending thoracic aorta measures 3.7 cm in AP diameter. No axillary adenopathy. Remaining mediastinal structures are unremarkable.  Limited images through the upper abdomen are within normal. There are mild degenerate changes of the spine.  Review of the MIP images confirms the above findings.  IMPRESSION: No acute cardiopulmonary disease and no evidence of pulmonary embolism.  5-6 mm nodule over the lingula. Recommend followup chest CT 6 months. This recommendation follows the consensus statement: Guidelines for Management of Small  Pulmonary Nodules Detected on CT Scans: A Statement from the [employer] as published  in Radiology 2005; [id number]. Online at: https://www.arnold.com/.  1.3 cm precarinal lymph node likely reactive.  Mild cardiomegaly. 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 Recommend annual imaging followup by CTA or MRA. This recommendation follows 2010 ACCF/AHA/AATS/ACR/ASA/SCA/SCAI/SIR/STS/SVM Guidelines for the Diagnosis and Management of Patients with Thoracic Aortic Disease. Circulation.2010; 121: O973-Z329.   Electronically Signed   By: Marin Olp M.D.   On: 04/21/2015 20:23   US Venous Img Lower Unilateral Right  04/23/2015   CLINICAL DATA:  Swelling for 1 month  EXAM: RIGHT LOWER EXTREMITY VENOUS DUPLEX ULTRASOUND  TECHNIQUE: Doppler venous assessment of the left lower extremity deep venous system was performed, including characterization of spectral flow, compressibility, and phasicity.  COMPARISON:  None.  FINDINGS: There is complete compressibility of the right common femoral, femoral, and popliteal veins. Doppler analysis demonstrates respiratory phasicity and augmentation of flow after calf compression. No evidence of calf vein DVT.  IMPRESSION: No evidence of right lower extremity DVT.   Electronically Signed   By: Marybelle Killings M.D.   On: 04/23/2015 15:24   Dg Chest Port 1 View  04/21/2015   CLINICAL DATA:  Worsening shortness of breath since yesterday.  EXAM: PORTABLE CHEST - 1 VIEW  COMPARISON:  09/08/2008  FINDINGS: Lungs are adequately inflated with minimal prominence of the perihilar markings. No evidence of effusion or focal consolidation. Cardiomediastinal silhouette and remainder of the exam is within normal.  IMPRESSION: Evidence of mild vascular congestion.   Electronically Signed   By: Marin Olp M.D.   On: 04/21/2015 18:14     CBC  Recent Labs Lab 04/21/15 1804 04/22/15 0813 04/23/15 0558 04/24/15 0515   WBC 9.5 7.7 8.1 9.5  HGB 13.3 12.4 12.9 12.8  HCT 40.6 37.6 38.7 40.1  PLT 235 200 194 206  MCV 91.5 91.4 92.4 92.3  MCH 30.0 30.1 30.8 29.5  MCHC 32.8 32.9 33.4 32.0  RDW 14.9* 14.9* 15.1* 15.5*    Chemistries   Recent Labs Lab 04/21/15 1804 04/22/15 0544 04/22/15 1256 04/23/15 0558 04/24/15 0514  NA 141 142 140 140 143  K 3.2* 3.0* 3.3* 3.4* 3.8  CL 103 106 104 105 107  CO2 26 29 28 28 28   GLUCOSE 114* 114* 172* 129* 139*  BUN 14 11 10 9 12   CREATININE 1.12* 0.93 0.90 0.86 0.97  CALCIUM 9.1 8.8* 9.0 9.2 9.0  MG  --   --   --   --  2.0  AST 31  --   --   --   --   ALT 28  --   --   --   --   ALKPHOS 71  --   --   --   --   BILITOT 0.5  --   --   --   --    ------------------------------------------------------------------------------------------------------------------ estimated creatinine clearance is 78 mL/min (by C-G formula based on Cr of 0.97). ------------------------------------------------------------------------------------------------------------------ No results for input(s): HGBA1C in the last 72 hours. ------------------------------------------------------------------------------------------------------------------ No results for input(s): CHOL, HDL, LDLCALC, TRIG, CHOLHDL, LDLDIRECT in the last 72 hours. ------------------------------------------------------------------------------------------------------------------ No results for input(s): TSH, T4TOTAL, T3FREE, THYROIDAB in the last 72 hours.  Invalid input(s): FREET3 ------------------------------------------------------------------------------------------------------------------ No results for input(s): VITAMINB12, FOLATE, FERRITIN, TIBC, IRON, RETICCTPCT in the last 72 hours.  Coagulation profile  Recent Labs Lab 04/21/15 1804  INR 1.02    No results for input(s): DDIMER in the last 72 hours.  Cardiac Enzymes  Recent Labs Lab 04/21/15 2352 04/22/15  0544 04/22/15 1103  TROPONINI  <0.03 <0.03 <0.03   ------------------------------------------------------------------------------------------------------------------ Invalid input(s): POCBNP    Assessment & Plan   1.  Atrial Fibrilation with RVR: Continue digoxin and metoprolol , patient on Eliquis plan for TEE with cardioversion tomorrow due to persistent symptoms  2. Shortness of Breath: may be due to atrial fibCT scan per PE  negative for any pulmonary pathology, on by mouth Lasix improved  3. HTN Continue Cardizem off normal regular home medications due to blood pressure being borderline normal  4. DM: Contorlled with home meds.Sliding scale insulin and  Amaryl  5. Right lower extremity swelling negative for DVT    Code Status Orders        Start     Ordered   04/21/15 2313  Full code   Continuous     04/21/15 2312       dipo d/c when stable per cardiology    Consults cardiology   DVT Prophylaxis  heparin   Lab Results  Component Value Date   PLT 206 04/24/2015     Time Spent in minutes  25 minutes     Dustin Flock M.D on 04/25/2015 at 1:33 PM  Between 7am to 6pm - Pager - [phone]  After 6pm go to [email or web address] - password [email or web address] [facility]   Office  [phone]

## 2015-04-26 ENCOUNTER — Encounter
Admission: EM | Disposition: A | Discharge status: Home / Self Care | Payer: Self-pay | Source: Home / Self Care | Attending: Internal Medicine

## 2015-04-26 ENCOUNTER — Inpatient Hospital Stay: Payer: Medicare Other | Admitting: Certified Registered"

## 2015-04-26 ENCOUNTER — Encounter: Payer: Self-pay | Admitting: Anesthesiology

## 2015-04-26 ENCOUNTER — Inpatient Hospital Stay (HOSPITAL_COMMUNITY)
Admit: 2015-04-26 | Discharge: 2015-04-26 | Disposition: A | Discharge status: Home / Self Care | Payer: Medicare Other | Attending: Physician Assistant | Admitting: Physician Assistant

## 2015-04-26 DIAGNOSIS — I5041 Acute combined systolic (congestive) and diastolic (congestive) heart failure: Secondary | ICD-10-CM | POA: Insufficient documentation

## 2015-04-26 DIAGNOSIS — I4892 Unspecified atrial flutter: Secondary | ICD-10-CM

## 2015-04-26 DIAGNOSIS — R0600 Dyspnea, unspecified: Secondary | ICD-10-CM

## 2015-04-26 DIAGNOSIS — I5043 Acute on chronic combined systolic (congestive) and diastolic (congestive) heart failure: Secondary | ICD-10-CM

## 2015-04-26 DIAGNOSIS — R609 Edema, unspecified: Secondary | ICD-10-CM | POA: Insufficient documentation

## 2015-04-26 DIAGNOSIS — Z01818 Encounter for other preprocedural examination: Secondary | ICD-10-CM

## 2015-04-26 HISTORY — PX: ELECTROPHYSIOLOGIC STUDY: SHX172A

## 2015-04-26 HISTORY — PX: TEE WITHOUT CARDIOVERSION: SHX5443

## 2015-04-26 LAB — GLUCOSE, CAPILLARY
GLUCOSE-CAPILLARY: 92 mg/dL (ref 65–99)
GLUCOSE-CAPILLARY: 80 mg/dL (ref 65–99)

## 2015-04-26 SURGERY — CARDIOVERSION (CATH LAB)
Anesthesia: Moderate Sedation

## 2015-04-26 SURGERY — ECHOCARDIOGRAM, TRANSESOPHAGEAL
Anesthesia: General

## 2015-04-26 MED ORDER — LIDOCAINE VISCOUS 2 % MT SOLN
OROMUCOSAL | Status: AC
  Administered 2015-04-26: 07:00:00
  Filled 2015-04-26: qty 15

## 2015-04-26 MED ORDER — METOPROLOL TARTRATE 25 MG PO TABS: 25.0000 mg | ORAL_TABLET | Freq: Two times a day (BID) | ORAL | Status: DC

## 2015-04-26 MED ORDER — DILTIAZEM HCL ER COATED BEADS 120 MG PO CP24
120.0000 mg | ORAL_CAPSULE | Freq: Every day | ORAL | Status: DC
  Administered 2015-04-26: 120 mg via ORAL
  Filled 2015-04-26: qty 1

## 2015-04-26 MED ORDER — DILTIAZEM HCL ER COATED BEADS 120 MG PO CP24: 120.0000 mg | ORAL_CAPSULE | Freq: Every day | ORAL | Status: DC

## 2015-04-26 MED ORDER — METOPROLOL TARTRATE 25 MG PO TABS
25.0000 mg | ORAL_TABLET | Freq: Two times a day (BID) | ORAL | Status: DC
Start: 2015-04-26 — End: 2015-04-26
  Administered 2015-04-26: 25 mg via ORAL
  Filled 2015-04-26: qty 1

## 2015-04-26 MED ORDER — BUTAMBEN-TETRACAINE-BENZOCAINE 2-2-14 % EX AERO
INHALATION_SPRAY | CUTANEOUS | Status: AC
Start: 2015-04-26 — End: 2015-04-26
  Administered 2015-04-26: 09:00:00
  Filled 2015-04-26: qty 20

## 2015-04-26 MED ORDER — PROPOFOL 10 MG/ML IV BOLUS
INTRAVENOUS | Status: DC | PRN
  Administered 2015-04-26 (×2): 50 mg via INTRAVENOUS
  Administered 2015-04-26: 30 mg via INTRAVENOUS
  Administered 2015-04-26: 20 mg via INTRAVENOUS

## 2015-04-26 NOTE — Progress Notes (Signed)
*  PRELIMINARY RESULTS* Echocardiogram Echocardiogram Transesophageal has been performed.  Paula Goodman 04/26/2015, 8:06 AM

## 2015-04-26 NOTE — Progress Notes (Signed)
Pt. Discharged to home via wc. Discharge instructions and medication regimen reviewed at bedside with patient. Pt. verbalizes understanding of instructions and medication regimen; she is aware her prescriptions can be picked up at her pharmacy. CHF education and medications discussed at length, pt given CHF booklet and able to verbalize importance of daily weights and recording them. Patient assessment unchanged from this morning. TELE and IV discontinued per policy.

## 2015-04-26 NOTE — Anesthesia Preprocedure Evaluation (Addendum)
Anesthesia Evaluation  Patient identified by MRN, date of birth, ID band Patient awake    Reviewed: Allergy & Precautions, NPO status , Patient's Chart, lab work & pertinent test results  History of Anesthesia Complications (+) PONV  Airway Mallampati: II  TM Distance: >3 FB     Dental no notable dental hx.    Pulmonary COPD COPD inhaler, Current Smoker,  breath sounds clear to auscultation  Pulmonary exam normal       Cardiovascular hypertension, + dysrhythmias Atrial Fibrillation Rhythm:Irregular     Neuro/Psych negative neurological ROS  negative psych ROS   GI/Hepatic Neg liver ROS, GERD-  Medicated and Controlled,  Endo/Other  diabetes, Well Controlled, Type 2  Renal/GU negative Renal ROS  negative genitourinary   Musculoskeletal negative musculoskeletal ROS (+)   Abdominal Normal abdominal exam  (+)   Peds negative pediatric ROS (+)  Hematology negative hematology ROS (+)   Anesthesia Other Findings Morbid obese  Reproductive/Obstetrics                           Anesthesia Physical Anesthesia Plan  ASA: III  Anesthesia Plan: General   Post-op Pain Management:    Induction: Intravenous  Airway Management Planned: Nasal Cannula  Additional Equipment:   Intra-op Plan:   Post-operative Plan:   Informed Consent: I have reviewed the patients History and Physical, chart, labs and discussed the procedure including the risks, benefits and alternatives for the proposed anesthesia with the patient or authorized representative who has indicated his/her understanding and acceptance.   Dental advisory given  Plan Discussed with: CRNA and Surgeon  Anesthesia Plan Comments:         Anesthesia Quick Evaluation

## 2015-04-26 NOTE — Progress Notes (Signed)
A&O. Independent. For TEE and cardioversion this AM.

## 2015-04-26 NOTE — Progress Notes (Signed)
Patient: Paula Goodman / Admit Date: 04/21/2015 / Date of Encounter: 04/26/2015, 8:10 AM   Subjective: No complaints, TEE Cardioversion this AM TAchycardia with walking, SOB  Review of Systems: ROS Constitutional: Negative for fever, chills, weight loss and diaphoresis.  HENT: Negative for congestion.  Eyes: Negative for discharge and redness.  Respiratory: Positive for shortness of breath. Negative for cough, hemoptysis, sputum production and wheezing.  Cardiovascular: Positive for leg swelling and PND. Negative for chest pain, palpitations, orthopnea and claudication.   RLE>LLE  Gastrointestinal: Negative for heartburn, nausea, vomiting, blood in stool and melena.  Musculoskeletal: Negative for falls.  Skin: Negative for rash.  Neurological:  Negative for sensory change, speech change and focal weakness.  Endo/Heme/Allergies: Does not bruise/bleed easily.  Psychiatric/Behavioral: no complaints All other systems reviewed and are negative.  Objective: Telemetry: Atrial flutter Physical Exam: Blood pressure 123/49, pulse 81, temperature 98.3 F (36.8 C), temperature source Oral, resp. rate 22, height 5\' 7"  (1.702 m), weight 279 lb 14.4 oz (126.962 kg), SpO2 98 %. Body mass index is 43.83 kg/(m^2). General: Well developed, well nourished, in no acute distress. Head: Normocephalic, atraumatic, sclera non-icteric, no xanthomas, nares are without discharge. Neck: Negative for carotid bruits. JVP not elevated. Lungs: Clear bilaterally to auscultation without wheezes, rales, or rhonchi. Breathing is unlabored. Heart: RRR S1 S2 without murmurs, rubs, or gallops.  Abdomen: Soft, non-tender, non-distended with normoactive bowel sounds. No rebound/guarding. Extremities: No clubbing or cyanosis. No edema. Distal pedal pulses are 2+ and equal bilaterally. Neuro: Alert and oriented X 3. Moves all extremities spontaneously. Psych:  Responds to questions appropriately with a normal  affect.   Intake/Output Summary (Last 24 hours) at 04/26/15 0810 Last data filed at 04/26/15 0801  Gross per 24 hour  Intake    660 ml  Output    650 ml  Net     10 ml    Inpatient Medications:  . apixaban  5 mg Oral BID  . digoxin  0.25 mg Oral Daily  . diltiazem  60 mg Oral 4 times per day  . furosemide  20 mg Oral Daily  . gabapentin  300 mg Oral QHS  . glimepiride  2 mg Oral Daily  . insulin aspart  0-9 Units Subcutaneous TID WC  . lidocaine      . metoprolol tartrate  25 mg Oral 3 times per day  . metoprolol tartrate  25 mg Oral BID  . pantoprazole  40 mg Oral Daily  . potassium chloride  40 mEq Oral BID  . pravastatin  10 mg Oral q1800  . sodium chloride  3 mL Intravenous Q12H  . sodium chloride  3 mL Intravenous Q12H   Infusions:  . sodium chloride    . sodium chloride    . sodium chloride      Labs:  Recent Labs  04/24/15 0514  NA 143  K 3.8  CL 107  CO2 28  GLUCOSE 139*  BUN 12  CREATININE 0.97  CALCIUM 9.0  MG 2.0   No results for input(s): AST, ALT, ALKPHOS, BILITOT, PROT, ALBUMIN in the last 72 hours.  Recent Labs  04/24/15 0515  WBC 9.5  HGB 12.8  HCT 40.1  MCV 92.3  PLT 206   No results for input(s): CKTOTAL, CKMB, TROPONINI in the last 72 hours. Invalid input(s): POCBNP No results for input(s): HGBA1C in the last 72 hours.   Weights: Filed Weights   04/21/15 1951 04/24/15 0453  Weight: 287 lb  6.4 oz (130.364 kg) 279 lb 14.4 oz (126.962 kg)     Radiology/Studies:  Ct Angio Chest Pe W/cm &/or Wo Cm  04/21/2015   CLINICAL DATA:  Progressive dyspnea 1-2 weeks worse today with tachycardia. Smoker.  EXAM: CT ANGIOGRAPHY CHEST WITH CONTRAST  TECHNIQUE: Multidetector CT imaging of the chest was performed using the standard protocol during bolus administration of intravenous contrast. Multiplanar CT image reconstructions and MIPs were obtained to evaluate the vascular anatomy.  CONTRAST:  5mL OMNIPAQUE IOHEXOL 350 MG/ML SOLN  COMPARISON:   Chest x-ray today.  FINDINGS: Lungs are adequately inflated without focal consolidation or effusion. There is minimal linear atelectasis/scarring over the lingula. There is a 5-6 mm nodular density over the lingula. Airways are within normal.  There is mild cardiomegaly. There is no evidence of pulmonary embolism. There is no significant hilar adenopathy. There is a 1.3 cm precarinal lymph node likely reactive. Ascending thoracic aorta measures 3.7 cm in AP diameter. No axillary adenopathy. Remaining mediastinal structures are unremarkable.  Limited images through the upper abdomen are within normal. There are mild degenerate changes of the spine.  Review of the MIP images confirms the above findings.  IMPRESSION: No acute cardiopulmonary disease and no evidence of pulmonary embolism.  5-6 mm nodule over the lingula. Recommend followup chest CT 6 months. This recommendation follows the consensus statement: Guidelines for Management of Small Pulmonary Nodules Detected on CT Scans: A Statement from the [employer] as published in Radiology 2005; 237:395-400. Online at: https://www.arnold.com/.  1.3 cm precarinal lymph node likely reactive.  Mild cardiomegaly. 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 Recommend annual imaging followup by CTA or MRA. This recommendation follows 2010 ACCF/AHA/AATS/ACR/ASA/SCA/SCAI/SIR/STS/SVM Guidelines for the Diagnosis and Management of Patients with Thoracic Aortic Disease. Circulation.2010; 121: R604-V409.   Electronically Signed   By: Marin Olp M.D.   On: 04/21/2015 20:23   US Venous Img Lower Unilateral Right  04/23/2015   CLINICAL DATA:  Swelling for 1 month  EXAM: RIGHT LOWER EXTREMITY VENOUS DUPLEX ULTRASOUND  TECHNIQUE: Doppler venous assessment of the left lower extremity deep venous system was performed, including characterization of spectral flow, compressibility, and phasicity.  COMPARISON:   None.  FINDINGS: There is complete compressibility of the right common femoral, femoral, and popliteal veins. Doppler analysis demonstrates respiratory phasicity and augmentation of flow after calf compression. No evidence of calf vein DVT.  IMPRESSION: No evidence of right lower extremity DVT.   Electronically Signed   By: Marybelle Killings M.D.   On: 04/23/2015 15:24   Dg Chest Port 1 View  04/21/2015   CLINICAL DATA:  Worsening shortness of breath since yesterday.  EXAM: PORTABLE CHEST - 1 VIEW  COMPARISON:  09/08/2008  FINDINGS: Lungs are adequately inflated with minimal prominence of the perihilar markings. No evidence of effusion or focal consolidation. Cardiomediastinal silhouette and remainder of the exam is within normal.  IMPRESSION: Evidence of mild vascular congestion.   Electronically Signed   By: Marin Olp M.D.   On: 04/21/2015 18:14     Assessment and Plan  69 y.o. female with h/o DM2, HTN, HLD, ongoing tobacco abuse, and obesity who presented to [facility] on 8/29 with 2 week history of increasing exertional dyspnea, now at rest as well, and was found to be in new onset Afib with RVR with rates into the 140's.  1. New onset flutter with RVR: Plan for TEE and cardioversion this AM -Continue digoxin 0.25 mg   -  Continue diltiazem 60 mg q 6 hours and metoprolol 25 bid -Lisinopril/HCTZ have been held to allow for more room for rate control -Possibly in the setting of hypokalemia  - Recommend proceeding with TEE guided cardioversion. I discussed the procedure in details as well as risks and benefits. - -CHADSVASc of 4 (HTN, diabetes, age x 1, female), giving her an estimated annual stroke risk of 4.0% --continue Eliquis 5 mg bid. Recommend outpatient EP evaluation for ablation to eliminate the need for anticoagulation.  2. Increased dyspnea/mild tachy-mediated cardiomyopathy: -In the setting of Afib/flutter with RVR -Continue PO Lasix 20 mg daily - Recommend outpatient nuclear stress  test.  Signed, Esmond Plants, MD [facility] 04/26/2015, 8:10 AM

## 2015-04-26 NOTE — Progress Notes (Signed)
Successful TEE/ cardioversion from atrial flutter to NSR No thrombus noted in the LA, LA appendage, LV. LV function mildly depressed prior to cardioversion (likely secondary to arrhthymia)  --Will change metoprolol to 25 mg po BID Change diltiazem to 120 mg daily Continue eliquis 5 mg po BID (needs coupon for free month)  Follow up in clinic in 1 to 2 weeks, [facility]

## 2015-04-26 NOTE — Progress Notes (Signed)
Cardioversion procedure note For atrial flutter, typical  Procedure Details:  Consent: Risks of procedure as well as the alternatives and risks of each were explained to the (patient/caregiver). Consent for procedure obtained.  Time Out: Verified patient identification, verified procedure, site/side was marked, verified correct patient position, special equipment/implants available, medications/allergies/relevent history reviewed, required imaging and test results available. Performed  Patient placed on cardiac monitor, pulse oximetry, supplemental oxygen as necessary.  Sedation given: propofol IV, Dr. Carroll Pacer pads placed anterior and posterior chest.   Cardioverted 1 time(s).  Cardioverted at  150 J. Synchronized biphasic Converted to NSR   Evaluation: Findings: Post procedure EKG shows: NSR Complications: None Patient did tolerate procedure well.  Time Spent Directly with the Patient:  45 minutes   Tim Darlisha Kelm, M.D., Ph.D. 

## 2015-04-26 NOTE — Anesthesia Postprocedure Evaluation (Signed)
  Anesthesia Post-op Note  Patient: Paula Goodman  Procedure(s) Performed: Procedure(s): TRANSESOPHAGEAL ECHOCARDIOGRAM (TEE) (N/A) CARDIOVERSION (N/A)  Anesthesia type:General  Patient location: specials  Post pain: Pain level controlled  Post assessment: Post-op Vital signs reviewed, Patient's Cardiovascular Status Stable, Respiratory Function Stable, Patent Airway and No signs of Nausea or vomiting  Post vital signs: Reviewed and stable  Last Vitals:  Filed Vitals:   04/26/15 0801  BP: 123/49  Pulse: 81  Temp:   Resp: 22    Level of consciousness: awake, alert  and patient cooperative  Complications: No apparent anesthesia complications

## 2015-04-26 NOTE — Transfer of Care (Signed)
Immediate Anesthesia Transfer of Care Note  Patient: Paula Goodman  Procedure(s) Performed: Procedure(s): TRANSESOPHAGEAL ECHOCARDIOGRAM (TEE) (N/A) CARDIOVERSION (N/A)  Patient Location: special procedures  Anesthesia Type:General  Level of Consciousness: awake  Airway & Oxygen Therapy: Patient Spontanous Breathing and Patient connected to nasal cannula oxygen  Post-op Assessment: Report given to RN  Post vital signs: Reviewed  Last Vitals:  Filed Vitals:   04/26/15 0801  BP: 123/49  Pulse: 81  Temp:   Resp: 22    Complications: No apparent anesthesia complications

## 2015-04-26 NOTE — Progress Notes (Signed)
RN spoke with patients pharmacy to verify prescriptions were sent there. Pharmacy confirmed they had received and were filling all of them.

## 2015-04-27 LAB — GLUCOSE, CAPILLARY: Glucose-Capillary: 131 mg/dL — ABNORMAL HIGH (ref 65–99)

## 2015-04-30 NOTE — Care Management (Signed)
Sent prior auth referral for eliquis through cover [employer]

## 2015-04-30 NOTE — Discharge Summary (Signed)
Tula Schryver, is a 69 y.o. female  DOB 07/18/1946  MRN [id number].  Admission date:  04/21/2015  Admitting Physician  Baxter Hire, MD  Discharge Date:  04/26/2015   Primary MD  Sherrin Daisy, MD  Recommendations for primary care physician for things to follow:   Follow up with Dr. Octavia BrucknerRockey Situ  In 1 week.   Admission Diagnosis  Dyspnea [R06.00] Atrial flutter, unspecified [I48.92]   Discharge Diagnosis  Dyspnea [R06.00] Atrial flutter, unspecified [I48.92]    Active Problems:   Atrial fibrillation with RVR   Atrial flutter, unspecified   Dyspnea   Swelling   Systolic and diastolic CHF, acute      Past Medical History  Diagnosis Date  . Diabetes mellitus   . Hypertension   . Obesity   . HLD (hyperlipidemia)   . Tobacco abuse   . GERD (gastroesophageal reflux disease)   . Fibrocystic breast disease     History reviewed. No pertinent past surgical history.     History of present illness and  Hospital Course:     Kindly see H&P for history of present illness and admission details, please review complete Labs, Consult reports and Test reports for all details in brief  HPI  from the history and physical done on the day of admission  69 year old female patient came in secondary to 3 week history of shortness of breath, leg swelling. Admitted for atrial fibrillation with RVR with heart rate 1 40 bpm, CHF exacerbation.  Hospital Course  1 atrial fibrillation with RVR. He did to ICU, started on IV Cardizem drip. In by cardiology from [facility] l patient was continued on heparin drip, and  DIGOXIN,. Metoprolol, Cardizem doses are adjusted. Patient was off Cardizem drip transferred to telemetry. Patient charts to ask score of 4. Echocardiogram showed EF of 45-50% with normal wall motion. In  persistent tachycardia more when she was walking. Patient had    cardioversion by cardiology on 9/2/. had successful conversion of atrial flutter normal sinus rhythm. No thrombus was noted in left atrium, left atrial appendage, left ventricle. Cardiology felt patient is  stable for discharge. Patient to discharge home with metoprolol 25 mg twice a day, Cardizem CD 120 mg daily,  eLIQUIS  5 mg by mouth twice a day, patient advised to follow-up with the  [place] [facility] in 1-2 weeks for outpatient stress test. 2. 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 For acute on chronic diastolic heart failure patient was given Lasix without improvement. Was to continue Lasix at home.  3.Diabetes type 2 patient to continue her Amaryl, METFORMIN,sliding scale with coverage. 4.Right lower  EXTREMITY ;cellulitis but the studies are negative for DVT. Tobacco abuse ;counseled.  Against Smoking. Discharge Condition: STABLE   Follow UP  Follow-up Information    Follow up with Sherrin Daisy, MD. Go on 05/08/2015.   Specialty:  Family Medicine   Why:   at 1030am, ccs   Contact information:   [place] [address] [phone]       Follow up with DUNN,RYAN, PA-C. Go on 05/07/2015.   Specialties:  Physician Assistant, Interventional Cardiology, Radiology   Why:   at 3pm, ccs   Contact information:   [place] [address] [phone]         Discharge Instructions  and  Discharge Medications  Medication List    STOP taking these medications        lisinopril-hydrochlorothiazide 20-25 MG per tablet  Commonly known as:  PRINZIDE,ZESTORETIC      TAKE these medications        albuterol 108 (90 BASE) MCG/ACT inhaler  Commonly known as:  PROVENTIL HFA;VENTOLIN HFA  Inhale 2 puffs into the lungs every 6 (six) hours as needed for wheezing or shortness of breath.      apixaban 5 MG Tabs tablet  Commonly known as:  ELIQUIS  Take 1 tablet (5 mg total) by mouth 2 (two) times daily.     colchicine 0.6 MG tablet  Take 0.6 mg by mouth daily as needed. For gout flare up.     digoxin 0.25 MG tablet  Commonly known as:  LANOXIN  Take 1 tablet (0.25 mg total) by mouth daily.     diltiazem 120 MG 24 hr tablet  Commonly known as:  CARDIZEM LA  Take 1 tablet (120 mg total) by mouth daily.     diltiazem 120 MG 24 hr capsule  Commonly known as:  CARDIZEM CD  Take 1 capsule (120 mg total) by mouth daily.     furosemide 20 MG tablet  Commonly known as:  LASIX  Take 1 tablet (20 mg total) by mouth daily.     gabapentin 300 MG capsule  Commonly known as:  NEURONTIN  Take 300 mg by mouth at bedtime.     glimepiride 2 MG tablet  Commonly known as:  AMARYL  Take 2 mg by mouth daily.     ketoconazole 2 % cream  Commonly known as:  NIZORAL  Apply 1 application topically 2 (two) times daily as needed. For rash     lovastatin 40 MG tablet  Commonly known as:  MEVACOR  Take 40 mg by mouth every evening. With dinner.     metFORMIN 1000 MG tablet  Commonly known as:  GLUCOPHAGE  Take 1,000 mg by mouth 2 (two) times daily with a meal.     metoprolol tartrate 25 MG tablet  Commonly known as:  LOPRESSOR  Take 1 tablet (25 mg total) by mouth 2 (two) times daily.     omeprazole 20 MG capsule  Commonly known as:  PRILOSEC  Take 20 mg by mouth daily.     SUNMARK GLUCOSE 4 GM chewable tablet  Generic drug:  glucose  Chew 8 g by mouth daily as needed. For low blood sugar.          Diet and Activity recommendation: See Discharge Instructions above   Consults obtained - cardiology   Major procedures and Radiology Reports - PLEASE review detailed and final reports for all details, in brief -     Ct Angio Chest Pe W/cm &/or Wo Cm  04/21/2015   CLINICAL DATA:  Progressive dyspnea 1-2 weeks worse today with tachycardia. Smoker.  EXAM: CT ANGIOGRAPHY  CHEST WITH CONTRAST  TECHNIQUE: Multidetector CT imaging of the chest was performed using the standard protocol during bolus administration of intravenous contrast. Multiplanar CT image reconstructions and MIPs were obtained to evaluate the vascular anatomy.  CONTRAST:  26mL OMNIPAQUE IOHEXOL 350 MG/ML SOLN  COMPARISON:  Chest x-ray today.  FINDINGS: Lungs are adequately inflated without focal consolidation or effusion. There is minimal linear atelectasis/scarring over the lingula. There is a 5-6 mm nodular density over the lingula. Airways are within normal.  There is mild cardiomegaly. There is no evidence of pulmonary embolism. There is no  significant hilar adenopathy. There is a 1.3 cm precarinal lymph node likely reactive. Ascending thoracic aorta measures 3.7 cm in AP diameter. No axillary adenopathy. Remaining mediastinal structures are unremarkable.  Limited images through the upper abdomen are within normal. There are mild degenerate changes of the spine.  Review of the MIP images confirms the above findings.  IMPRESSION: No acute cardiopulmonary disease and no evidence of pulmonary embolism.  5-6 mm nodule over the lingula. Recommend followup chest CT 6 months. This recommendation follows the consensus statement: Guidelines for Management of Small Pulmonary Nodules Detected on CT Scans: A Statement from the [employer] as published in Radiology 2005; 237:395-400. Online at: https://www.arnold.com/.  1.3 cm precarinal lymph node likely reactive.  Mild cardiomegaly. 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 Recommend annual imaging followup by CTA or MRA. This recommendation follows 2010 ACCF/AHA/AATS/ACR/ASA/SCA/SCAI/SIR/STS/SVM Guidelines for the Diagnosis and Management of Patients with Thoracic Aortic Disease. Circulation.2010; 121: C789-F810.   Electronically Signed   By: Marin Olp M.D.   On: 04/21/2015 20:23   US Venous Img  Lower Unilateral Right  04/23/2015   CLINICAL DATA:  Swelling for 1 month  EXAM: RIGHT LOWER EXTREMITY VENOUS DUPLEX ULTRASOUND  TECHNIQUE: Doppler venous assessment of the left lower extremity deep venous system was performed, including characterization of spectral flow, compressibility, and phasicity.  COMPARISON:  None.  FINDINGS: There is complete compressibility of the right common femoral, femoral, and popliteal veins. Doppler analysis demonstrates respiratory phasicity and augmentation of flow after calf compression. No evidence of calf vein DVT.  IMPRESSION: No evidence of right lower extremity DVT.   Electronically Signed   By: Marybelle Killings M.D.   On: 04/23/2015 15:24   Dg Chest Port 1 View  04/21/2015   CLINICAL DATA:  Worsening shortness of breath since yesterday.  EXAM: PORTABLE CHEST - 1 VIEW  COMPARISON:  09/08/2008  FINDINGS: Lungs are adequately inflated with minimal prominence of the perihilar markings. No evidence of effusion or focal consolidation. Cardiomediastinal silhouette and remainder of the exam is within normal.  IMPRESSION: Evidence of mild vascular congestion.   Electronically Signed   By: Marin Olp M.D.   On: 04/21/2015 18:14    Micro Results   Recent Results (from the past 240 hour(s))  MRSA PCR Screening     Status: None   Collection Time: 04/21/15 11:10 PM  Result Value Ref Range Status   MRSA by PCR NEGATIVE NEGATIVE Final    Comment:        The GeneXpert MRSA Assay (FDA approved for NASAL specimens only), is one component of a comprehensive MRSA colonization surveillance program. It is not intended to diagnose MRSA infection nor to guide or monitor treatment for MRSA infections.        Today   Subjective:   Harleigh Civello today has no headache,no chest abdominal pain,no new weakness tingling or numbness, feels much better wants to go home today.   Objective:   Blood pressure 104/54, pulse 84, temperature 97.9 F (36.6 C), temperature source  Oral, resp. rate 16, height 5\' 7"  (1.702 m), weight 126.962 kg (279 lb 14.4 oz), SpO2 97 %.  No intake or output data in the 24 hours ending 04/30/15 0818  Exam Awake Alert, Oriented x 3, No new F.N deficits, Normal affect [place].AT,PERRAL Supple Neck,No JVD, No cervical lymphadenopathy appriciated.  Symmetrical Chest wall movement, Good air movement bilaterally, CTAB RRR,No Gallops,Rubs or new Murmurs, No Parasternal Heave +ve B.Sounds, Abd  Soft, Non tender, No organomegaly appriciated, No rebound -guarding or rigidity. No Cyanosis, Clubbing or edema, No new Rash or bruise  Data Review   CBC w Diff:  Lab Results  Component Value Date   WBC 9.5 04/24/2015   HGB 12.8 04/24/2015   HCT 40.1 04/24/2015   PLT 206 04/24/2015    CMP:  Lab Results  Component Value Date   NA 143 04/24/2015   K 3.8 04/24/2015   CL 107 04/24/2015   CO2 28 04/24/2015   BUN 12 04/24/2015   CREATININE 0.97 04/24/2015   PROT 7.3 04/21/2015   ALBUMIN 4.2 04/21/2015   BILITOT 0.5 04/21/2015   ALKPHOS 71 04/21/2015   AST 31 04/21/2015   ALT 28 04/21/2015  .   Total Time in preparing paper work, data evaluation and todays exam - 1 minutes  Yemariam Magar M.D on 04/26/2015 at 8:18 AM

## 2015-05-01 ENCOUNTER — Telehealth: Payer: Self-pay | Admitting: *Deleted

## 2015-05-01 ENCOUNTER — Other Ambulatory Visit: Payer: Self-pay | Admitting: *Deleted

## 2015-05-01 MED ORDER — APIXABAN 5 MG PO TABS: 5.0000 mg | ORAL_TABLET | Freq: Two times a day (BID) | ORAL | Status: DC

## 2015-05-01 NOTE — Telephone Encounter (Signed)
Pt has been approved for Eliquis 5 mg tablet 04/30/2015-04/29/2016.

## 2015-05-03 ENCOUNTER — Encounter: Payer: Self-pay | Admitting: Cardiovascular Disease

## 2015-05-07 ENCOUNTER — Encounter: Payer: Self-pay | Admitting: Nurse Practitioner

## 2015-05-07 ENCOUNTER — Ambulatory Visit (INDEPENDENT_AMBULATORY_CARE_PROVIDER_SITE_OTHER): Payer: Medicare Other | Admitting: Nurse Practitioner

## 2015-05-07 VITALS — BP 130/62 | HR 95 | Ht 67.5 in | Wt 280.0 lb

## 2015-05-07 DIAGNOSIS — E119 Type 2 diabetes mellitus without complications: Secondary | ICD-10-CM | POA: Insufficient documentation

## 2015-05-07 DIAGNOSIS — I10 Essential (primary) hypertension: Secondary | ICD-10-CM | POA: Diagnosis not present

## 2015-05-07 DIAGNOSIS — I4892 Unspecified atrial flutter: Secondary | ICD-10-CM | POA: Diagnosis not present

## 2015-05-07 DIAGNOSIS — I429 Cardiomyopathy, unspecified: Secondary | ICD-10-CM

## 2015-05-07 DIAGNOSIS — I5043 Acute on chronic combined systolic (congestive) and diastolic (congestive) heart failure: Secondary | ICD-10-CM

## 2015-05-07 DIAGNOSIS — E785 Hyperlipidemia, unspecified: Secondary | ICD-10-CM | POA: Insufficient documentation

## 2015-05-07 MED ORDER — METOPROLOL TARTRATE 50 MG PO TABS: 50.0000 mg | ORAL_TABLET | Freq: Two times a day (BID) | ORAL | Status: DC

## 2015-05-07 MED ORDER — FUROSEMIDE 40 MG PO TABS: 40.0000 mg | ORAL_TABLET | Freq: Every day | ORAL | Status: DC

## 2015-05-07 NOTE — Progress Notes (Signed)
Patient Name: Paula Goodman Date of Encounter: 05/07/2015  Primary Care Provider:  Sherrin Daisy, MD Primary Cardiologist:  Johnny Bridge, MD   Chief Complaint  69 y/o female s/p recent admission for Aflutter and [place] who presents for f/u.  Past Medical History   Past Medical History  Diagnosis Date  . Type II diabetes mellitus   . Essential hypertension   . Morbid obesity   . HLD (hyperlipidemia)   . History of tobacco abuse   . GERD (gastroesophageal reflux disease)   . Fibrocystic breast disease   . Paroxysmal atrial flutter     a. Dx 03/2015. CHA2DS2VASc = 5-->Elquis;  b. 04/2015 TEE/DCCV; c. 05/07/2015 recurrent AFlutter.  . Cardiomyopathy     a. Presumed to be tachy-mediated in setting of atrial flutter;  b. 03/2015 Echo: Ef45-50%, no rwma, mild MR, mildly dil LA/RV.   Past Surgical History  Procedure Laterality Date  . Tee without cardioversion N/A 04/26/2015    Procedure: TRANSESOPHAGEAL ECHOCARDIOGRAM (TEE);  Surgeon: Minna Merritts, MD;  Location: [facility] ORS;  Service: Cardiovascular;  Laterality: N/A;  . Electrophysiologic study N/A 04/26/2015    Procedure: CARDIOVERSION;  Surgeon: Minna Merritts, MD;  Location: [facility] ORS;  Service: Cardiovascular;  Laterality: N/A;    Allergies  No Known Allergies  HPI  69 y/o female with a h/o HTN, HL, DM, and obesity.  She was recently admitted to [facility] with dyspnea and volume overload and found to be in aflutter with RVR.  She was placed on IV dilt, bb, digoxin, and eliquis.  Echo showed mild LV dysfxn with an EF of 45-50%.  She underwent TEE and DCCV on 9/2 with conversion to sinus rhythm.  She was d/c'd and did well for about a week but this past Saturday, 9/9, she noted recurrent DOE.  She checked her HR a few times over the weekend and following any amount of activity, she noted HR's into the 140's.  At rest however, she was running in the 70's.  Since then, she has continued to note DOE.  Her wt is also up 4 lbs.  She presents  today for hospital f/u and ECG shows Aflutter @ 95.  She denies palpitations, c/p, pnd, orthopnea, n, v, dizziness, syncope, or early satiety but has noted mild lower ext edema.  She has been compliant with her meds.  Home Medications  Prior to Admission medications   Medication Sig Start Date End Date Taking? Authorizing Provider  albuterol (PROVENTIL HFA;VENTOLIN HFA) 108 (90 BASE) MCG/ACT inhaler Inhale 2 puffs into the lungs every 6 (six) hours as needed for wheezing or shortness of breath.   Yes Historical Provider, MD  apixaban (ELIQUIS) 5 MG TABS tablet Take 1 tablet (5 mg total) by mouth 2 (two) times daily. 05/01/15  Yes Minna Merritts, MD  colchicine 0.6 MG tablet Take 0.6 mg by mouth daily as needed. For gout flare up.   Yes Historical Provider, MD  digoxin (LANOXIN) 0.25 MG tablet Take 1 tablet (0.25 mg total) by mouth daily. 04/25/15  Yes Dustin Flock, MD  diltiazem (CARDIZEM LA) 120 MG 24 hr tablet Take 1 tablet (120 mg total) by mouth daily. 04/25/15  Yes Dustin Flock, MD  furosemide (LASIX) 20 MG tablet Take 1 tablet (20 mg total) by mouth daily. 04/25/15  Yes Dustin Flock, MD  gabapentin (NEURONTIN) 300 MG capsule Take 300 mg by mouth at bedtime.   Yes Historical Provider, MD  glimepiride (AMARYL) 2 MG tablet Take 2  mg by mouth daily. 02/27/15  Yes Historical Provider, MD  glucose (SUNMARK GLUCOSE) 4 GM chewable tablet Chew 8 g by mouth daily as needed. For low blood sugar.   Yes Historical Provider, MD  ketoconazole (NIZORAL) 2 % cream Apply 1 application topically 2 (two) times daily as needed. For rash 04/02/14  Yes Historical Provider, MD  lovastatin (MEVACOR) 40 MG tablet Take 40 mg by mouth every evening. With dinner. 02/27/15  Yes Historical Provider, MD  metFORMIN (GLUCOPHAGE) 1000 MG tablet Take 1,000 mg by mouth 2 (two) times daily with a meal. 03/30/15  Yes Historical Provider, MD  metoprolol tartrate (LOPRESSOR) 25 MG tablet Take 1 tablet (25 mg total) by mouth 2 (two) times  daily. 04/26/15  Yes Epifanio Lesches, MD  omeprazole (PRILOSEC) 20 MG capsule Take 20 mg by mouth daily. 02/27/15  Yes Historical Provider, MD    Review of Systems  DOE with lower ext edema and wt gain since 9/10.  She denies chest pain, palpitations, pnd, orthopnea, n, v, dizziness, syncope, or early satiety. All other systems reviewed and are otherwise negative except as noted above.  Physical Exam  VS:  BP 130/62 mmHg  Pulse 95  Ht 5' 7.5" (1.715 m)  Wt 280 lb (127.007 kg)  BMI 43.18 kg/m2 , BMI Body mass index is 43.18 kg/(m^2). GEN: Well nourished, well developed, in no acute distress. HEENT: normal. Neck: Supple, difficult to assess JVP 2/2 girth.  No carotid bruits, or masses. Cardiac: Irreg, no murmurs, rubs, or gallops. No clubbing, cyanosis, 1+ bilat LE edema.  Radials/DP/PT 2+ and equal bilaterally.  Respiratory:  Respirations regular and unlabored, clear to auscultation bilaterally. GI: Soft, nontender, nondistended, BS + x 4. MS: no deformity or atrophy. Skin: warm and dry, no rash. Neuro:  Strength and sensation are intact. Psych: Normal affect.  Accessory Clinical Findings  ECG - atrial flutter, 95, left axis.  Assessment & Plan  1.  Paroxysmal Atrial Flutter:  Pt presents today for hosp f/u after recent hospitalization or the aflutter requiring TEE/DCCV.  She felt good for about a week but since 9/9, she has noted DOE.  She denies palpitations.  She is back in atrial flutter by ECG today.  Given timing of Ss, I suspect this began on Saturday.  She has been compliant with her meds.  I discussed her case with Dr. Rockey Situ today.  I will increase her lopressor to 50 mg BID.  Cont dilt and digoxin.  We have arranged for her to be seen in the atrial arrhythmia clinic @ [facility] on Monday 9/19 @ which point RFCA can be considered (she is interested in this approach if deemed appropriate).  Cont Eliquis.  2.  Cardiomyopathy/Acute on chronic combined systolic/diastolic CHF:  Pt  has noted increasing DOE, 4 lbs wt gain, and edema since 9/9.  She did not tolerate Aflutter previously.  She has mild edema on exam.  I've asked her to increase her lasix to 40 mg daily.  I will obtain a BMET today to eval renal fxn and potassium as she was hypokalemic during her hospitalization recently.  She will contact us if wt does not improve, at which point we would need to escalate her lasix dose further.  Hospital notes recommend outpt myoview once she is maintaining sinus rhythm - we will need to keep this in mind going forward.  3.  Essential HTN:  Stable.  4.  Morbid Obesity:  Would benefit from outpt nutritional counseling.  We can look  into this further once aflutter dealt with.  Should likely have outpt sleep eval.  5.  HL: cont statin.  6.  DMII:  Followed by PCP, whom she sees tomorrow.  She says that she's interested in getting off of metformin as it causes her diarrhea and she's concerned that that is causing her to be hypokalemic and making her more prone to having aflutter.  7.  Dispo:  F/u bmet today.  F/u in arrhythmia clinic on 9/19.   Murray Hodgkins, NP 05/07/2015, 4:29 PM

## 2015-05-07 NOTE — Patient Instructions (Addendum)
Medication Instructions:  Please increase metoprolol to 50 mg twice daily Please increase lasix to 40 mg daily   Labwork: BMET  Testing/Procedures: None  Follow-Up: You are scheduled to see Roderic Palau in the afib clinic on Monday, Sept 19 @ 11:00  Please arrive @ 10:30 Please go to the ER side of [facility], park in the underground parking garage The code for parking is 0090 Take the elevators to the 1st floor and follow signs for the Heart & Vascular clinic Their # is [phone]  Atrial Flutter Atrial flutter is a heart rhythm that can cause the heart to beat very fast (tachycardia). It originates in the upper chambers of the heart (atria). In atrial flutter, the top chambers of the heart (atria) often beat much faster than the bottom chambers of the heart (ventricles). Atrial flutter has a regular "saw toothed" appearance in an EKG readout. An EKG is a test that records the electrical activity of the heart. Atrial flutter can cause the heart to beat up to 150 beats per minute (BPM). Atrial flutter can either be short lived (paroxysmal) or permanent.  CAUSES  Causes of atrial flutter can be many. Some of these include:  Heart related issues:  Heart attack (myocardial infarction).  Heart failure.  Heart valve problems.  Poorly controlled high blood pressure (hypertension).  After open heart surgery.  Lung related issues:  A blood clot in the lungs (pulmonary embolism).  Chronic obstructive pulmonary disease (COPD). Medications used to treat COPD can attribute to atrial flutter.  Other related causes:  Hyperthyroidism.  Caffeine.  Some decongestant cold medications.  Low electrolyte levels such as potassium or magnesium.  Cocaine. SYMPTOMS  An awareness of your heart beating rapidly (palpitations).  Shortness of breath.  Chest pain.  Low blood pressure (hypotension).  Dizziness or fainting. DIAGNOSIS  Different tests can be performed to diagnose  atrial flutter.   An EKG.  Holter monitor. This is a 24-hour recording of your heart rhythm. You will also be given a diary. Write down all symptoms that you have and what you were doing at the time you experienced symptoms.  Cardiac event monitor. This small device can be worn for up to 30 days. When you have heart symptoms, you will push a button on the device. This will then record your heart rhythm.  Echocardiogram. This is an imaging test to look at your heart. Your caregiver will look at your heart valves and the ventricles.  Stress test. This test can help determine if the atrial flutter is related to exercise or if coronary artery disease is present.  Laboratory studies will look at certain blood levels like:  Complete blood count (CBC).  Potassium.  Magnesium.  Thyroid function. TREATMENT  Treatment of atrial flutter varies. A combination of therapies may be used or sometimes atrial flutter may need only 1 type of treatment.  Lab work: If your blood work, such as your electrolytes (potassium, magnesium) or your thyroid function tests, are abnormal, your caregiver will treat them accordingly.  Medication:  There are several different types of medications that can convert your heart to a normal rhythm and prevent atrial flutter from reoccurring.  Nonsurgical procedures: Nonsurgical techniques may be used to control atrial flutter. Some examples include:  Cardioversion. This technique uses either drugs or an electrical shock to restore a normal heart rhythm:  Cardioversion drugs may be given through an intravenous (IV) line to help "reset" the heart rhythm.  In electrical cardioversion, your caregiver shocks  your heart with electrical energy. This helps to reset the heartbeat to a normal rhythm.  Ablation. If atrial flutter is a persistent problem, an ablation may be needed. This procedure is done under mild sedation. High frequency radio-wave energy is used to destroy the  area of heart tissue responsible for atrial flutter. SEEK IMMEDIATE MEDICAL CARE IF:  You have:  Dizziness.  Near fainting or fainting.  Shortness of breath.  Chest pain or pressure.  Sudden nausea or vomiting.  Profuse sweating. If you have the above symptoms, call your local emergency service immediately! Do not drive yourself to the hospital. MAKE SURE YOU:   Understand these instructions.  Will watch your condition.  Will get help right away if you are not doing well or get worse. Document Released: 12/27/2008 Document Revised: 12/25/2013 Document Reviewed: 12/27/2008 [facility] Patient Information 2015 [place], [place]. This information is not intended to replace advice given to you by your health care provider. Make sure you discuss any questions you have with your health care provider.

## 2015-05-08 ENCOUNTER — Other Ambulatory Visit: Payer: Self-pay

## 2015-05-08 LAB — BASIC METABOLIC PANEL
BUN / CREAT RATIO: 9 — AB (ref 11–26)
BUN: 8 mg/dL (ref 8–27)
CO2: 22 mmol/L (ref 18–29)
Calcium: 9.4 mg/dL (ref 8.7–10.3)
Chloride: 102 mmol/L (ref 97–108)
Creatinine, Ser: 0.91 mg/dL (ref 0.57–1.00)
GFR calc Af Amer: 76 mL/min/{1.73_m2} (ref >59)
GFR, EST NON AFRICAN AMERICAN: 65 mL/min/{1.73_m2} (ref >59)
GLUCOSE: 129 mg/dL — AB (ref 65–99)
POTASSIUM: 3.5 mmol/L (ref 3.5–5.2)
SODIUM: 143 mmol/L (ref 134–144)

## 2015-05-08 MED ORDER — POTASSIUM CHLORIDE CRYS ER 20 MEQ PO TBCR: 40.0000 meq | EXTENDED_RELEASE_TABLET | Freq: Every day | ORAL | Status: DC

## 2015-05-13 ENCOUNTER — Ambulatory Visit (HOSPITAL_COMMUNITY)
Admission: RE | Admit: 2015-05-13 | Discharge: 2015-05-13 | Disposition: A | Discharge status: Home / Self Care | Payer: Medicare Other | Source: Ambulatory Visit | Attending: Nurse Practitioner | Admitting: Nurse Practitioner

## 2015-05-13 VITALS — BP 134/86 | HR 64 | Ht 67.0 in | Wt 281.8 lb

## 2015-05-13 DIAGNOSIS — I10 Essential (primary) hypertension: Secondary | ICD-10-CM | POA: Diagnosis not present

## 2015-05-13 DIAGNOSIS — Z7902 Long term (current) use of antithrombotics/antiplatelets: Secondary | ICD-10-CM | POA: Diagnosis not present

## 2015-05-13 DIAGNOSIS — Z833 Family history of diabetes mellitus: Secondary | ICD-10-CM | POA: Insufficient documentation

## 2015-05-13 DIAGNOSIS — I4891 Unspecified atrial fibrillation: Secondary | ICD-10-CM | POA: Diagnosis present

## 2015-05-13 DIAGNOSIS — I429 Cardiomyopathy, unspecified: Secondary | ICD-10-CM | POA: Diagnosis not present

## 2015-05-13 DIAGNOSIS — Z79899 Other long term (current) drug therapy: Secondary | ICD-10-CM | POA: Diagnosis not present

## 2015-05-13 DIAGNOSIS — E785 Hyperlipidemia, unspecified: Secondary | ICD-10-CM | POA: Insufficient documentation

## 2015-05-13 DIAGNOSIS — Z8249 Family history of ischemic heart disease and other diseases of the circulatory system: Secondary | ICD-10-CM | POA: Diagnosis not present

## 2015-05-13 DIAGNOSIS — R0683 Snoring: Secondary | ICD-10-CM | POA: Insufficient documentation

## 2015-05-13 DIAGNOSIS — I481 Persistent atrial fibrillation: Secondary | ICD-10-CM | POA: Diagnosis not present

## 2015-05-13 DIAGNOSIS — I4819 Other persistent atrial fibrillation: Secondary | ICD-10-CM

## 2015-05-13 DIAGNOSIS — K219 Gastro-esophageal reflux disease without esophagitis: Secondary | ICD-10-CM | POA: Diagnosis not present

## 2015-05-13 DIAGNOSIS — Z6841 Body Mass Index (BMI) 40.0 and over, adult: Secondary | ICD-10-CM | POA: Insufficient documentation

## 2015-05-13 DIAGNOSIS — Z87891 Personal history of nicotine dependence: Secondary | ICD-10-CM | POA: Diagnosis not present

## 2015-05-13 DIAGNOSIS — E119 Type 2 diabetes mellitus without complications: Secondary | ICD-10-CM | POA: Insufficient documentation

## 2015-05-13 DIAGNOSIS — I5040 Unspecified combined systolic (congestive) and diastolic (congestive) heart failure: Secondary | ICD-10-CM | POA: Diagnosis not present

## 2015-05-14 ENCOUNTER — Other Ambulatory Visit (INDEPENDENT_AMBULATORY_CARE_PROVIDER_SITE_OTHER): Payer: Medicare Other | Admitting: *Deleted

## 2015-05-14 ENCOUNTER — Encounter (HOSPITAL_COMMUNITY): Payer: Self-pay | Admitting: Nurse Practitioner

## 2015-05-14 DIAGNOSIS — I4891 Unspecified atrial fibrillation: Secondary | ICD-10-CM | POA: Diagnosis not present

## 2015-05-14 NOTE — Progress Notes (Addendum)
Patient ID: Paula Goodman, female   DOB: 01-06-1946, 69 y.o.   MRN: [id number]     Primary Care Physician: Sherrin Daisy, MD Referring Physician: Ignacia Bayley, NP   Paula Goodman is a 69 y.o. female with a h/o admission to [facility] with dyspnea and volume overload and found to be in aflutter with RVR. She was placed on IV dilt, bb, digoxin, and eliquis. Echo showed mild LV dysfxn with an EF of 45-50%. She underwent TEE and DCCV on 9/2 with conversion to sinus rhythm. She was d/c'd and did well for about a week but this past Saturday, 9/9, she noted recurrent DOE and had ERAF. She checked her HR a few times over the weekend and following any amount of activity, she noted HR's into the 140's. At rest however, she was running in the 70's. Since then, she has continued to note DOE. Her wt is also up 4 lbs. She presented to see Ignacia Bayley NP, for hospital f/u and ECG showed Aflutter @ 95. She had both afib and aflutter while in hospital. She is being evaluated today in the afib clinic for possibly of ablation. EKG today shows afib rate controlled  64 bpm.   Pt does not use alcohol, no current tobacco use, no caffeine use. Sedentary lifestyle, positive for obesity. Positive for snoring. Chadsvasc score of at least 4. Tolerating eliquis without bleeding issues.  Today, she denies symptoms of  chest pain, orthopnea, PND,  dizziness, presyncope, syncope, or neurologic sequela.Positive for exertional dyspnea, fatigue, chronic pedal edema. The patient is tolerating medications without difficulties and is otherwise without complaint today.   Past Medical History  Diagnosis Date  . Type II diabetes mellitus   . Essential hypertension   . Morbid obesity   . HLD (hyperlipidemia)   . History of tobacco abuse   . GERD (gastroesophageal reflux disease)   . Fibrocystic breast disease   . Paroxysmal atrial flutter     a. Dx 03/2015. CHA2DS2VASc = 5-->Elquis;  b. 04/2015 TEE/DCCV; c. 05/07/2015  recurrent AFlutter.  . Cardiomyopathy     a. Presumed to be tachy-mediated in setting of atrial flutter;  b. 03/2015 Echo: Ef45-50%, no rwma, mild MR, mildly dil LA/RV.   Past Surgical History  Procedure Laterality Date  . Tee without cardioversion N/A 04/26/2015    Procedure: TRANSESOPHAGEAL ECHOCARDIOGRAM (TEE);  Surgeon: Minna Merritts, MD;  Location: [facility] ORS;  Service: Cardiovascular;  Laterality: N/A;  . Electrophysiologic study N/A 04/26/2015    Procedure: CARDIOVERSION;  Surgeon: Minna Merritts, MD;  Location: [facility] ORS;  Service: Cardiovascular;  Laterality: N/A;    Current Outpatient Prescriptions  Medication Sig Dispense Refill  . albuterol (PROVENTIL HFA;VENTOLIN HFA) 108 (90 BASE) MCG/ACT inhaler Inhale 2 puffs into the lungs every 6 (six) hours as needed for wheezing or shortness of breath.    Marland Kitchen apixaban (ELIQUIS) 5 MG TABS tablet Take 1 tablet (5 mg total) by mouth 2 (two) times daily. 60 tablet 0  . colchicine 0.6 MG tablet Take 0.6 mg by mouth daily as needed. For gout flare up.    . digoxin (LANOXIN) 0.25 MG tablet Take 1 tablet (0.25 mg total) by mouth daily. 30 tablet 0  . diltiazem (CARDIZEM LA) 120 MG 24 hr tablet Take 1 tablet (120 mg total) by mouth daily. 30 tablet 0  . furosemide (LASIX) 40 MG tablet Take 1 tablet (40 mg total) by mouth daily. 30 tablet 6  . gabapentin (NEURONTIN) 300 MG capsule Take  300 mg by mouth at bedtime.    Marland Kitchen glimepiride (AMARYL) 2 MG tablet Take 2 mg by mouth daily.  1  . glucose (SUNMARK GLUCOSE) 4 GM chewable tablet Chew 8 g by mouth daily as needed. For low blood sugar.    . ketoconazole (NIZORAL) 2 % cream Apply 1 application topically 2 (two) times daily as needed. For rash    . lovastatin (MEVACOR) 40 MG tablet Take 40 mg by mouth every evening. With dinner.  1  . metFORMIN (GLUCOPHAGE) 1000 MG tablet Take 1,000 mg by mouth 2 (two) times daily with a meal.  4  . metoprolol tartrate (LOPRESSOR) 50 MG tablet Take 1 tablet (50 mg total) by  mouth 2 (two) times daily. 60 tablet 6  . omeprazole (PRILOSEC) 20 MG capsule Take 20 mg by mouth daily.  1  . potassium chloride SA (K-DUR,KLOR-CON) 20 MEQ tablet Take 2 tablets (40 mEq total) by mouth daily. 60 tablet 6   No current facility-administered medications for this encounter.    No Known Allergies  Social History   Social History  . Marital Status: Widowed    Spouse Name: N/A  . Number of Children: N/A  . Years of Education: N/A   Occupational History  . Not on file.   Social History Main Topics  . Smoking status: Former Smoker -- 0.25 packs/day for 40 years    Types: Cigarettes  . Smokeless tobacco: Not on file  . Alcohol Use: No  . Drug Use: No  . Sexual Activity: Not on file   Other Topics Concern  . Not on file   Social History Narrative    Family History  Problem Relation Age of Onset  . Hypertension Mother   . Diabetes Mother     ROS- All systems are reviewed and negative except as per the HPI above  Physical Exam: Filed Vitals:   05/13/15 1101  BP: 134/86  Pulse: 64  Height: 5\' 7"  (1.702 m)  Weight: 281 lb 12.8 oz (127.824 kg)    GEN- The patient is well appearing, alert and oriented x 3 today.   Head- normocephalic, atraumatic Eyes-  Sclera clear, conjunctiva pink Ears- hearing intact Oropharynx- clear Neck- supple, no JVP Lymph- no cervical lymphadenopathy Lungs- Clear to ausculation bilaterally, normal work of breathing Heart- Irregular rate and rhythm, no murmurs, rubs or gallops, PMI not laterally displaced GI- soft, NT, ND, + BS Extremities- no clubbing, cyanosis, or edema MS- no significant deformity or atrophy Skin- no rash or lesion Psych- euthymic mood, full affect Neuro- strength and sensation are intact  EKG- Afib with with v rate at 64 bpm, QRS 82 ms, QTc 367 ms,  Epic records reviewed [facility] d/c reviewed Ignacia Bayley note reviewed ECHO- Procedure narrative: Transthoracic echocardiography. The study was technically  difficult. - Left ventricle: The cavity size was normal. There was mild concentric hypertrophy. Systolic function was mildly reduced. The estimated ejection fraction was in the range of 45% to 50%. Wall motion was normal; there were no regional wall motion abnormalities. - Mitral valve: There was mild regurgitation. - Left atrium: The atrium was mildly dilated. - Right ventricle: The cavity size was mildly dilated. Wall thickness was normal. Systolic function was mildly reduced. - Pulmonary arteries: Systolic pressure was within the normal range.  Impressions:  - Rhythm is atrial fibrillation, rate 100 bpm. Challenging image quality. Reduced EF likely secondary to arrhythmia.  Assessment and Plan: 1. Persistent symptomatic afib  ERAF  following DCCV Unfortunately,  she is not a candidate for fib/flutter ablation at this point,  To follow guidelines, she has to have symptomatic afib , which she does, and fail at least one antiarrythmic first. She may not be a good  candidate for 1c agents,  no CAD by history but with mild lv dysfunction. Amiodarone or Tikosyn may be other options, depends if pt willing to go to [facility] for loading and if can afford cost of Tikosyn.   2. Lifestyle risk factors which will undermine return of SR Increase in exercise encouraged, along with weight loss Try to lose 5-10% of current weight.  3. Snoring  Sleep study recommended  4. DM Encouraged good management  5. Systolic/diastolic heart failure acute Weight appears stable Avoid salt, limit fluids   Offered to  initiate antiarrythmics here but pt lives in [place] and her daughter has to get off work to bring her to the doctor. She feels this would be a hardship on her and daughter to have to come to [facility] for treatment and would like her f/u at the [place] office. I spoke to Dr. Rockey Situ and he will arrange f/u.  Geroge Baseman [place], [facility] [address] [place], [place] [address] [phone]

## 2015-05-15 LAB — BASIC METABOLIC PANEL
BUN / CREAT RATIO: 9 — AB (ref 11–26)
BUN: 8 mg/dL (ref 8–27)
CALCIUM: 8.8 mg/dL (ref 8.7–10.3)
CHLORIDE: 99 mmol/L (ref 97–108)
CO2: 23 mmol/L (ref 18–29)
Creatinine, Ser: 0.88 mg/dL (ref 0.57–1.00)
GFR calc Af Amer: 79 mL/min/{1.73_m2} (ref >59)
GFR calc non Af Amer: 68 mL/min/{1.73_m2} (ref >59)
GLUCOSE: 255 mg/dL — AB (ref 65–99)
POTASSIUM: 3.7 mmol/L (ref 3.5–5.2)
Sodium: 140 mmol/L (ref 134–144)

## 2015-05-15 NOTE — Addendum Note (Signed)
Encounter addended by: Sherran Needs, NP on: 05/15/2015  8:54 AM<BR>     Documentation filed: Notes Section

## 2015-05-16 ENCOUNTER — Other Ambulatory Visit: Payer: Medicare Other

## 2015-05-20 ENCOUNTER — Encounter: Payer: Self-pay | Admitting: Cardiovascular Disease

## 2015-05-20 ENCOUNTER — Ambulatory Visit (INDEPENDENT_AMBULATORY_CARE_PROVIDER_SITE_OTHER): Payer: Medicare Other | Admitting: Cardiovascular Disease

## 2015-05-20 VITALS — BP 122/80 | HR 69 | Ht 67.5 in | Wt 279.8 lb

## 2015-05-20 DIAGNOSIS — I4891 Unspecified atrial fibrillation: Secondary | ICD-10-CM | POA: Diagnosis not present

## 2015-05-20 DIAGNOSIS — I481 Persistent atrial fibrillation: Secondary | ICD-10-CM | POA: Diagnosis not present

## 2015-05-20 DIAGNOSIS — I5041 Acute combined systolic (congestive) and diastolic (congestive) heart failure: Secondary | ICD-10-CM

## 2015-05-20 DIAGNOSIS — I10 Essential (primary) hypertension: Secondary | ICD-10-CM

## 2015-05-20 DIAGNOSIS — I4892 Unspecified atrial flutter: Secondary | ICD-10-CM

## 2015-05-20 DIAGNOSIS — R0602 Shortness of breath: Secondary | ICD-10-CM

## 2015-05-20 DIAGNOSIS — E119 Type 2 diabetes mellitus without complications: Secondary | ICD-10-CM

## 2015-05-20 DIAGNOSIS — I4819 Other persistent atrial fibrillation: Secondary | ICD-10-CM

## 2015-05-20 MED ORDER — POTASSIUM CHLORIDE CRYS ER 10 MEQ PO TBCR: 20.0000 meq | EXTENDED_RELEASE_TABLET | Freq: Two times a day (BID) | ORAL | Status: DC | PRN

## 2015-05-20 MED ORDER — FLECAINIDE ACETATE 100 MG PO TABS: 100.0000 mg | ORAL_TABLET | Freq: Two times a day (BID) | ORAL | Status: DC

## 2015-05-20 NOTE — Progress Notes (Signed)
Patient ID: Paula Goodman, female    DOB: [birth year], 69 y.o.   MRN: [id number]  HPI Comments: Paula Goodman  is a pleasant 69 year old woman with morbid obesity, recent admission to the hospital September 2016 with atrial flutter, difficulty in controlling her rate who underwent TEE and cardioversion, converted back to atrial flutter one week later. She presents for routine follow-up Other medical issues include morbid obesity, diabetes type 2, hyperlipidemia, hypertension  She was seen by our office and referred to [facility] for possible ablation. Metoprolol was increased at that time up to 50 mg twice a day, digoxin, diltiazem, anticoagulation EP evaluated the patient and recommended anti-rhythmic medications first  She presents today and reports having general malaise, shortness of breath with exertion. Better compared to her prior clinic visit but does not feel well and would like to restore normal sinus rhythm.  She reports blood pressure has been well today well-controlled. She does not measure her heart rate at home  EKG on today's visit shows atrial fibrillation with ventricular rate 69 bpm, nonspecific ST abnormality  Other past medical history Lasix increased to 40 mg daily on prior clinic visit, lab work checked at that time was essentially normal     No Known Allergies  Current Outpatient Prescriptions on File Prior to Visit  Medication Sig Dispense Refill  . albuterol (PROVENTIL HFA;VENTOLIN HFA) 108 (90 BASE) MCG/ACT inhaler Inhale 2 puffs into the lungs every 6 (six) hours as needed for wheezing or shortness of breath.    Marland Kitchen apixaban (ELIQUIS) 5 MG TABS tablet Take 1 tablet (5 mg total) by mouth 2 (two) times daily. 60 tablet 0  . colchicine 0.6 MG tablet Take 0.6 mg by mouth daily as needed. For gout flare up.    . digoxin (LANOXIN) 0.25 MG tablet Take 1 tablet (0.25 mg total) by mouth daily. 30 tablet 0  . diltiazem (CARDIZEM LA) 120 MG 24 hr tablet Take 1 tablet (120  mg total) by mouth daily. 30 tablet 0  . furosemide (LASIX) 40 MG tablet Take 1 tablet (40 mg total) by mouth daily. 30 tablet 6  . gabapentin (NEURONTIN) 300 MG capsule Take 300 mg by mouth at bedtime.    Marland Kitchen glimepiride (AMARYL) 2 MG tablet Take 2 mg by mouth daily.  1  . glucose (SUNMARK GLUCOSE) 4 GM chewable tablet Chew 8 g by mouth daily as needed. For low blood sugar.    . ketoconazole (NIZORAL) 2 % cream Apply 1 application topically 2 (two) times daily as needed. For rash    . lovastatin (MEVACOR) 40 MG tablet Take 40 mg by mouth every evening. With dinner.  1  . metFORMIN (GLUCOPHAGE) 1000 MG tablet Take 1,000 mg by mouth 2 (two) times daily with a meal.  4  . metoprolol tartrate (LOPRESSOR) 50 MG tablet Take 1 tablet (50 mg total) by mouth 2 (two) times daily. 60 tablet 6  . omeprazole (PRILOSEC) 20 MG capsule Take 20 mg by mouth daily.  1   No current facility-administered medications on file prior to visit.    Past Medical History  Diagnosis Date  . Type II diabetes mellitus   . Essential hypertension   . Morbid obesity   . HLD (hyperlipidemia)   . History of tobacco abuse   . GERD (gastroesophageal reflux disease)   . Fibrocystic breast disease   . Paroxysmal atrial flutter     a. Dx 03/2015. CHA2DS2VASc = 5-->Elquis;  b. 04/2015 TEE/DCCV; c. 05/07/2015 recurrent  AFlutter.  . Cardiomyopathy     a. Presumed to be tachy-mediated in setting of atrial flutter;  b. 03/2015 Echo: Ef45-50%, no rwma, mild MR, mildly dil LA/RV.    Past Surgical History  Procedure Laterality Date  . Tee without cardioversion N/A 04/26/2015    Procedure: TRANSESOPHAGEAL ECHOCARDIOGRAM (TEE);  Surgeon: Minna Merritts, MD;  Location: [facility] ORS;  Service: Cardiovascular;  Laterality: N/A;  . Electrophysiologic study N/A 04/26/2015    Procedure: CARDIOVERSION;  Surgeon: Minna Merritts, MD;  Location: [facility] ORS;  Service: Cardiovascular;  Laterality: N/A;    Social History  reports that she has quit  smoking. Her smoking use included Cigarettes. She has a 10 pack-year smoking history. She does not have any smokeless tobacco history on file. She reports that she does not drink alcohol or use illicit drugs.  Family History family history includes Diabetes in her mother; Hypertension in her mother.   Review of Systems  Constitutional: Positive for fatigue.  Respiratory: Positive for shortness of breath.   Cardiovascular: Negative.   Gastrointestinal: Negative.   Musculoskeletal: Negative.   Neurological: Negative.   Hematological: Negative.   Psychiatric/Behavioral: Negative.   All other systems reviewed and are negative.   BP 122/80 mmHg  Pulse 69  Ht 5' 7.5" (1.715 m)  Wt 279 lb 12 oz (126.894 kg)  BMI 43.14 kg/m2  Physical Exam  Constitutional: She is oriented to person, place, and time. She appears well-developed and well-nourished.  HENT:  Head: Normocephalic.  Nose: Nose normal.  Mouth/Throat: Oropharynx is clear and moist.  Eyes: Conjunctivae are normal. Pupils are equal, round, and reactive to light.  Neck: Normal range of motion. Neck supple. No JVD present.  Cardiovascular: Normal rate, regular rhythm, normal heart sounds and intact distal pulses.  Exam reveals no gallop and no friction rub.   No murmur heard. Pulmonary/Chest: Effort normal and breath sounds normal. No respiratory distress. She has no wheezes. She has no rales. She exhibits no tenderness.  Abdominal: Soft. Bowel sounds are normal. She exhibits no distension. There is no tenderness.  Musculoskeletal: Normal range of motion. She exhibits no edema or tenderness.  Lymphadenopathy:    She has no cervical adenopathy.  Neurological: She is alert and oriented to person, place, and time. Coordination normal.  Skin: Skin is warm and dry. No rash noted. No erythema.  Psychiatric: She has a normal mood and affect. Her behavior is normal. Judgment and thought content normal.

## 2015-05-20 NOTE — Assessment & Plan Note (Signed)
Blood pressure is well controlled on today's visit.  Decrease metoprolol as above

## 2015-05-20 NOTE — Assessment & Plan Note (Signed)
We have encouraged continued exercise, careful diet management in an effort to lose weight. 

## 2015-05-20 NOTE — Assessment & Plan Note (Signed)
Recommended that she continue on her Lasix daily with potassium Appears relatively euvolemic Extra Lasix for any leg edema, abdominal swelling, shortness of breath

## 2015-05-20 NOTE — Patient Instructions (Signed)
You are in atrial fibrillation  Please decrease the metoprolol down to 25 mg twice a day Please start flecainide 50 mg twice a day for one week to 10 days, Come in for EKG If still atrial fibrillation, We will increase the dose up to 100 mg twice a day  EKG in 1 week to 10 days If still in atrial fibrillation, we will schedule a cardioversion  Please call us if you have new issues that need to be addressed before your next appt.  Your physician wants you to follow-up in: 1 month.

## 2015-05-20 NOTE — Assessment & Plan Note (Signed)
New atrial fibrillation on today's visit. Previously noted to be in atrial flutter Recommended we treat her atrial fibrillation with flecainide 50 mg twice a day with titration up to 100 mg twice a day If she does not convert to normal sinus rhythm, we'll repeat cardioversion She will stay on anticoagulation. Secondary to fatigue, we'll decrease metoprolol back to 25 mill grams twice a day

## 2015-05-20 NOTE — Assessment & Plan Note (Signed)
We have encouraged continued exercise, careful diet management in an effort to lose weight. Likely has underlying sleep apnea She will talk with primary care about setting up a sleep study

## 2015-05-20 NOTE — Assessment & Plan Note (Signed)
Seen in [facility] for flutter ablation. Felt not to be a candidate for trying antiarrhythmics medications. Now in atrial fibrillation

## 2015-05-27 ENCOUNTER — Ambulatory Visit (INDEPENDENT_AMBULATORY_CARE_PROVIDER_SITE_OTHER): Payer: Medicare Other

## 2015-05-27 VITALS — BP 162/88 | HR 58 | Ht 66.0 in | Wt 282.5 lb

## 2015-05-27 DIAGNOSIS — I4891 Unspecified atrial fibrillation: Secondary | ICD-10-CM

## 2015-05-27 MED ORDER — LISINOPRIL-HYDROCHLOROTHIAZIDE 20-25 MG PO TABS: 1.0000 | ORAL_TABLET | Freq: Every day | ORAL | Status: DC

## 2015-05-27 MED ORDER — METOPROLOL TARTRATE 25 MG PO TABS
25.0000 mg | ORAL_TABLET | Freq: Two times a day (BID) | ORAL | Status: DC
Start: 2015-05-27 — End: 2015-06-20

## 2015-05-27 NOTE — Addendum Note (Signed)
Addended by: Dede Query R on: 05/27/2015 12:23 PM   Modules accepted: Orders

## 2015-05-27 NOTE — Patient Instructions (Addendum)
Please stop Digoxin  Please decrease metoprolol to 25 mg twice daily  Please monitor your blood pressure Call the office if it remains elevated  Please follow up w/ Dr. Rockey Situ or Christell Faith, PA in 2-3 weeks

## 2015-05-27 NOTE — Progress Notes (Addendum)
1.) Reason for visit: EKG  2.) Name of MD requesting visit: Dr. Rockey Situ  3.) H&P: At ov 05/20/15, pt was in afib and advised:   "Please decrease the metoprolol down to 25 mg twice a day Please start flecainide 50 mg twice a day for one week to 10 days, Come in for EKG If still atrial fibrillation, We will increase the dose up to 100 mg twice a day  EKG in 1 week to 10 days If still in atrial fibrillation, we will schedule a cardioversion"  4.) ROS related to problem: EKG shows sinus bradycardia today w/ HR of 58.  Pt reports fatigue, she did not read her directions from last week's ov and did not decreased her metoprolol as advised.    5.) Assessment and plan per MD: Dr. Fletcher Anon reviewed pt's EKG and recommends pt stop digoxin, decrease metoprolol to 25 mg BID, monitor BP and home, and f/u in 2-3 weeks w/ either Dr. Rockey Situ or Christell Faith, PA.   *Called pt to let her know that Dr. Rockey Situ reviewed her chart and advises that she restart lisinopril HCTZ 20-25 mg once daily.  She is agreeable and reports that she has plenty left over from taking this previously.

## 2015-06-20 ENCOUNTER — Ambulatory Visit (INDEPENDENT_AMBULATORY_CARE_PROVIDER_SITE_OTHER): Payer: Medicare Other | Admitting: Cardiovascular Disease

## 2015-06-20 ENCOUNTER — Encounter: Payer: Self-pay | Admitting: Cardiovascular Disease

## 2015-06-20 VITALS — BP 112/82 | HR 66 | Ht 68.0 in | Wt 278.8 lb

## 2015-06-20 DIAGNOSIS — I5041 Acute combined systolic (congestive) and diastolic (congestive) heart failure: Secondary | ICD-10-CM | POA: Diagnosis not present

## 2015-06-20 DIAGNOSIS — E785 Hyperlipidemia, unspecified: Secondary | ICD-10-CM

## 2015-06-20 DIAGNOSIS — E118 Type 2 diabetes mellitus with unspecified complications: Secondary | ICD-10-CM

## 2015-06-20 DIAGNOSIS — I4891 Unspecified atrial fibrillation: Secondary | ICD-10-CM

## 2015-06-20 DIAGNOSIS — I4892 Unspecified atrial flutter: Secondary | ICD-10-CM | POA: Diagnosis not present

## 2015-06-20 MED ORDER — METOPROLOL TARTRATE 25 MG PO TABS: 25.0000 mg | ORAL_TABLET | Freq: Two times a day (BID) | ORAL | Status: DC

## 2015-06-20 NOTE — Assessment & Plan Note (Signed)
Dietary changes suggested. Dietary guide provided

## 2015-06-20 NOTE — Assessment & Plan Note (Signed)
Recommended that she stay on her Lasix. She could try 20 mg daily with her potassium Would increase back to 40 mg daily for any leg edema or shortness of breath, weight gain

## 2015-06-20 NOTE — Progress Notes (Signed)
Patient ID: Paula Goodman, female    DOB: 1946-01-02, 69 y.o.   MRN: [id number]  HPI Comments: Paula Goodman  is a pleasant 69 year old woman with morbid obesity,  admission to the hospital September 2016 with atrial flutter, difficulty in controlling her rate who underwent TEE and cardioversion, converted back to atrial flutter one week later. She presents for routine follow-up for her atrial fibrillation/flutter. Other medical issues include morbid obesity, diabetes type 2, hyperlipidemia, hypertension On her last clinic visit she was in atrial fibrillation. Referred to [facility] for possible ablation, recommendation was made to try antiarrhythmic medication first  She was started on flecainide 50 mg twice a day with titration up to 100 mg twice a day Metoprolol was decreased down to 25 mg twice a day for bradycardia, digoxin was held  In follow-up today she reports that she has not been taking her diltiazem She has been compliant with anticoagulation She would like to come off some of her medications. In general she feels well with no complaints She denies any lower extremity edema  EKG on today's visit shows normal sinus rhythm with rate 66 bpm, nonspecific T wave abnormality  Other past medical history Lasix increased to 40 mg daily on prior clinic visit, lab work checked at that time was essentially normal      No Known Allergies  Current Outpatient Prescriptions on File Prior to Visit  Medication Sig Dispense Refill  . albuterol (PROVENTIL HFA;VENTOLIN HFA) 108 (90 BASE) MCG/ACT inhaler Inhale 2 puffs into the lungs every 6 (six) hours as needed for wheezing or shortness of breath.    Marland Kitchen apixaban (ELIQUIS) 5 MG TABS tablet Take 1 tablet (5 mg total) by mouth 2 (two) times daily. 60 tablet 0  . colchicine 0.6 MG tablet Take 0.6 mg by mouth daily as needed. For gout flare up.    . flecainide (TAMBOCOR) 100 MG tablet Take 1 tablet (100 mg total) by mouth 2 (two) times daily. 60  tablet 6  . furosemide (LASIX) 40 MG tablet Take 1 tablet (40 mg total) by mouth daily. 30 tablet 6  . gabapentin (NEURONTIN) 300 MG capsule Take 300 mg by mouth at bedtime.    Marland Kitchen glimepiride (AMARYL) 2 MG tablet Take 2 mg by mouth daily.  1  . glucose (SUNMARK GLUCOSE) 4 GM chewable tablet Chew 8 g by mouth daily as needed. For low blood sugar.    . ketoconazole (NIZORAL) 2 % cream Apply 1 application topically 2 (two) times daily as needed. For rash    . lovastatin (MEVACOR) 40 MG tablet Take 40 mg by mouth every evening. With dinner.  1  . metFORMIN (GLUCOPHAGE) 1000 MG tablet Take 1,000 mg by mouth 2 (two) times daily with a meal.  4  . omeprazole (PRILOSEC) 20 MG capsule Take 20 mg by mouth daily.  1  . potassium chloride SA (K-DUR,KLOR-CON) 10 MEQ tablet Take 2 tablets (20 mEq total) by mouth 2 (two) times daily as needed. 120 tablet 6   No current facility-administered medications on file prior to visit.    Past Medical History  Diagnosis Date  . Type II diabetes mellitus ([place])   . Essential hypertension   . Morbid obesity ([place])   . HLD (hyperlipidemia)   . History of tobacco abuse   . GERD (gastroesophageal reflux disease)   . Fibrocystic breast disease   . Paroxysmal atrial flutter ([place])     a. Dx 03/2015. CHA2DS2VASc = 5-->Elquis;  b. 04/2015  TEE/DCCV; c. 05/07/2015 recurrent AFlutter.  . Cardiomyopathy ([place])     a. Presumed to be tachy-mediated in setting of atrial flutter;  b. 03/2015 Echo: Ef45-50%, no rwma, mild MR, mildly dil LA/RV.    Past Surgical History  Procedure Laterality Date  . Tee without cardioversion N/A 04/26/2015    Procedure: TRANSESOPHAGEAL ECHOCARDIOGRAM (TEE);  Surgeon: Minna Merritts, MD;  Location: [facility] ORS;  Service: Cardiovascular;  Laterality: N/A;  . Electrophysiologic study N/A 04/26/2015    Procedure: CARDIOVERSION;  Surgeon: Minna Merritts, MD;  Location: [facility] ORS;  Service: Cardiovascular;  Laterality: N/A;    Social History  reports that  she has quit smoking. Her smoking use included Cigarettes. She has a 10 pack-year smoking history. She does not have any smokeless tobacco history on file. She reports that she does not drink alcohol or use illicit drugs.  Family History family history includes Diabetes in her mother; Hypertension in her mother.   Review of Systems  Constitutional: Negative.   Respiratory: Negative.   Cardiovascular: Negative.   Gastrointestinal: Negative.   Musculoskeletal: Negative.   Neurological: Negative.   Hematological: Negative.   Psychiatric/Behavioral: Negative.   All other systems reviewed and are negative.   BP 112/82 mmHg  Pulse 66  Ht 5\' 8"  (1.727 m)  Wt 278 lb 12 oz (126.44 kg)  BMI 42.39 kg/m2  Physical Exam  Constitutional: She is oriented to person, place, and time. She appears well-developed and well-nourished.  HENT:  Head: Normocephalic.  Nose: Nose normal.  Mouth/Throat: Oropharynx is clear and moist.  Eyes: Conjunctivae are normal. Pupils are equal, round, and reactive to light.  Neck: Normal range of motion. Neck supple. No JVD present.  Cardiovascular: Normal rate, regular rhythm, normal heart sounds and intact distal pulses.  Exam reveals no gallop and no friction rub.   No murmur heard. Pulmonary/Chest: Effort normal and breath sounds normal. No respiratory distress. She has no wheezes. She has no rales. She exhibits no tenderness.  Abdominal: Soft. Bowel sounds are normal. She exhibits no distension. There is no tenderness.  Musculoskeletal: Normal range of motion. She exhibits no edema or tenderness.  Lymphadenopathy:    She has no cervical adenopathy.  Neurological: She is alert and oriented to person, place, and time. Coordination normal.  Skin: Skin is warm and dry. No rash noted. No erythema.  Psychiatric: She has a normal mood and affect. Her behavior is normal. Judgment and thought content normal.

## 2015-06-20 NOTE — Assessment & Plan Note (Signed)
We have encouraged continued exercise, careful diet management in an effort to lose weight. 

## 2015-06-20 NOTE — Assessment & Plan Note (Signed)
Recommended that she stay on her lovastatin

## 2015-06-20 NOTE — Assessment & Plan Note (Signed)
Suggested that she stay on her current medications She is not taking diltiazem. We will take this off her list. Rhythm control with flecainide and metoprolol Stressed importance of compliance with her anticoagulation to avoid stroke

## 2015-06-20 NOTE — Patient Instructions (Signed)
You are doing well. No medication changes were made.  Please call us if you have new issues that need to be addressed before your next appt.  Your physician wants you to follow-up in: 6 months.  You will receive a reminder letter in the mail two months in advance. If you don't receive a letter, please call our office to schedule the follow-up appointment.   

## 2015-06-20 NOTE — Assessment & Plan Note (Signed)
Maintaining normal sinus rhythm. Medications as above

## 2015-07-08 ENCOUNTER — Ambulatory Visit: Payer: Medicare Other | Admitting: Cardiovascular Disease

## 2015-07-29 ENCOUNTER — Telehealth: Payer: Self-pay

## 2015-07-29 NOTE — Telephone Encounter (Signed)
Samples placed at front desk for pick up. 

## 2015-07-29 NOTE — Telephone Encounter (Signed)
Pt would like Eliquis samples.  

## 2015-08-22 ENCOUNTER — Other Ambulatory Visit: Payer: Self-pay | Admitting: *Deleted

## 2015-08-22 MED ORDER — METOPROLOL TARTRATE 25 MG PO TABS: 25.0000 mg | ORAL_TABLET | Freq: Two times a day (BID) | ORAL | Status: DC

## 2015-08-22 MED ORDER — POTASSIUM CHLORIDE CRYS ER 10 MEQ PO TBCR: 20.0000 meq | EXTENDED_RELEASE_TABLET | Freq: Two times a day (BID) | ORAL | Status: DC | PRN

## 2015-08-22 MED ORDER — FLECAINIDE ACETATE 100 MG PO TABS: 100.0000 mg | ORAL_TABLET | Freq: Two times a day (BID) | ORAL | Status: DC

## 2015-09-16 ENCOUNTER — Other Ambulatory Visit: Payer: Self-pay | Admitting: Cardiovascular Disease

## 2015-10-22 ENCOUNTER — Telehealth: Payer: Self-pay | Admitting: Cardiovascular Disease

## 2015-10-22 NOTE — Telephone Encounter (Signed)
Placed samples of Eliquis 5 mg at the front desk for pick up.

## 2015-10-22 NOTE — Telephone Encounter (Signed)
Patient calling the office for samples of medication:   1.  What medication and dosage are you requesting samples for? Eliquis   2.  Are you currently out of this medication? Has only 4 pills left

## 2016-06-01 ENCOUNTER — Ambulatory Visit (INDEPENDENT_AMBULATORY_CARE_PROVIDER_SITE_OTHER): Payer: Medicare Other | Admitting: Cardiovascular Disease

## 2016-06-01 ENCOUNTER — Encounter: Payer: Self-pay | Admitting: Cardiovascular Disease

## 2016-06-01 VITALS — BP 152/98 | HR 70 | Ht 68.0 in | Wt 280.8 lb

## 2016-06-01 DIAGNOSIS — I429 Cardiomyopathy, unspecified: Secondary | ICD-10-CM | POA: Diagnosis not present

## 2016-06-01 DIAGNOSIS — E7800 Pure hypercholesterolemia, unspecified: Secondary | ICD-10-CM

## 2016-06-01 DIAGNOSIS — I5041 Acute combined systolic (congestive) and diastolic (congestive) heart failure: Secondary | ICD-10-CM

## 2016-06-01 DIAGNOSIS — I10 Essential (primary) hypertension: Secondary | ICD-10-CM

## 2016-06-01 DIAGNOSIS — E118 Type 2 diabetes mellitus with unspecified complications: Secondary | ICD-10-CM

## 2016-06-01 DIAGNOSIS — I4891 Unspecified atrial fibrillation: Secondary | ICD-10-CM | POA: Diagnosis not present

## 2016-06-01 MED ORDER — LISINOPRIL 40 MG PO TABS: 40.0000 mg | ORAL_TABLET | Freq: Every day | ORAL | 3 refills | Status: DC

## 2016-06-01 MED ORDER — FUROSEMIDE 40 MG PO TABS: 40.0000 mg | ORAL_TABLET | Freq: Two times a day (BID) | ORAL | 6 refills | Status: DC | PRN

## 2016-06-01 NOTE — Progress Notes (Signed)
Cardiology Office Note  Date:  06/01/2016   ID:  Paula Goodman, Paula Goodman Jul 06, 1946, MRN [id number]  PCP:  Sherrin Daisy, MD   Chief Complaint  Patient presents with  . OTHER    6 month f/u c/o elevated BP. Meds reviewed verbally with pt.    HPI:  Paula Goodman  is a pleasant 70 year old woman with morbid obesity,  admission to the hospital September 2016 with atrial flutter, difficulty in controlling her rate who underwent TEE and cardioversion, converted back to atrial flutter one week later. She presents for routine follow-up for her atrial fibrillation/flutter. Other medical issues include morbid obesity, diabetes type 2, hyperlipidemia, hypertension On her last clinic visit she was in atrial fibrillation. Referred to [facility] for possible ablation, recommendation was made to try antiarrhythmic medication first  In follow-up today, she reports that blood pressure has been running high for at least one week Has been eating out more, high salt and fluid intake Does not take potassium daily Rare diarrhea, then takes potassium (side effect of metformin) She has noticed worsening lower extremity edema Sometimes will skip Lasix, other times takes 20 up to 40 mg  Lab work reviewed with her in detail HBA1B 7.3 LDL 114  Denies any significant tachycardia concerning for arrhythmia Tolerating flecainide 100 mg twice a day, metoprolol 25 mg twice a day  EKG on today's visit shows normal sinus rhythm with rate 70 bpm, nonspecific T wave abnormality  Other past medical history Lasix increased to 40 mg daily on prior clinic visit, lab work checked at that time was essentially normal   PMH:   has a past medical history of Cardiomyopathy (Paula Goodman); Essential hypertension; Fibrocystic breast disease; GERD (gastroesophageal reflux disease); History of tobacco abuse; HLD (hyperlipidemia); Morbid obesity ([place]); Paroxysmal atrial flutter (Paula Goodman); and Type II diabetes mellitus (Paula Goodman).  PSH:    Past  Surgical History:  Procedure Laterality Date  . ELECTROPHYSIOLOGIC STUDY N/A 04/26/2015   Procedure: CARDIOVERSION;  Surgeon: Minna Merritts, MD;  Location: [facility] ORS;  Service: Cardiovascular;  Laterality: N/A;  . TEE WITHOUT CARDIOVERSION N/A 04/26/2015   Procedure: TRANSESOPHAGEAL ECHOCARDIOGRAM (TEE);  Surgeon: Minna Merritts, MD;  Location: [facility] ORS;  Service: Cardiovascular;  Laterality: N/A;    Current Outpatient Prescriptions  Medication Sig Dispense Refill  . albuterol (PROVENTIL HFA;VENTOLIN HFA) 108 (90 BASE) MCG/ACT inhaler Inhale 2 puffs into the lungs every 6 (six) hours as needed for wheezing or shortness of breath.    . colchicine 0.6 MG tablet Take 0.6 mg by mouth daily as needed. For gout flare up.    Marland Kitchen ELIQUIS 5 MG TABS tablet TAKE 1 TABLET(5 MG) BY MOUTH TWICE DAILY 60 tablet 6  . flecainide (TAMBOCOR) 100 MG tablet Take 1 tablet (100 mg total) by mouth 2 (two) times daily. 180 tablet 3  . furosemide (LASIX) 40 MG tablet Take 1 tablet (40 mg total) by mouth 2 (two) times daily as needed. 180 tablet 6  . gabapentin (NEURONTIN) 300 MG capsule Take 300 mg by mouth at bedtime.    Marland Kitchen glimepiride (AMARYL) 2 MG tablet Take 2 mg by mouth daily.  1  . glucose (SUNMARK GLUCOSE) 4 GM chewable tablet Chew 8 g by mouth daily as needed. For low blood sugar.    . ketoconazole (NIZORAL) 2 % cream Apply 1 application topically 2 (two) times daily as needed. For rash    . lisinopril (PRINIVIL,ZESTRIL) 40 MG tablet Take 1 tablet (40 mg total) by mouth daily. 90 tablet  3  . lovastatin (MEVACOR) 40 MG tablet Take 40 mg by mouth every evening. With dinner.  1  . metFORMIN (GLUCOPHAGE) 1000 MG tablet Take 1,000 mg by mouth 2 (two) times daily with a meal.  4  . metoprolol tartrate (LOPRESSOR) 25 MG tablet Take 1 tablet (25 mg total) by mouth 2 (two) times daily. 180 tablet 3  . omeprazole (PRILOSEC) 20 MG capsule Take 20 mg by mouth daily.  1  . potassium chloride (K-DUR,KLOR-CON) 10 MEQ tablet  Take 2 tablets (20 mEq total) by mouth 2 (two) times daily as needed. 180 tablet 3   No current facility-administered medications for this visit.      Allergies:   Review of patient's allergies indicates no known allergies.   Social History:  The patient  reports that she has been smoking Cigarettes.  She has a 10.00 pack-year smoking history. She has never used smokeless tobacco. She reports that she does not drink alcohol or use drugs.   Family History:   family history includes Diabetes in her mother; Hypertension in her mother.    Review of Systems: Review of Systems  Constitutional: Negative.   Respiratory: Positive for shortness of breath.   Cardiovascular: Positive for leg swelling.  Gastrointestinal: Negative.   Musculoskeletal: Negative.   Neurological: Negative.   Psychiatric/Behavioral: Negative.   All other systems reviewed and are negative.    PHYSICAL EXAM: VS:  BP (!) 152/98 (BP Location: Left Arm, Patient Position: Sitting, Cuff Size: Large)   Pulse 70   Ht 5\' 8"  (1.727 m)   Wt 280 lb 12 oz (127.3 kg)   BMI 42.69 kg/m  , BMI Body mass index is 42.69 kg/m. GEN: Well nourished, well developed, in no acute distress , obese HEENT: normal  Neck: no JVD, carotid bruits, or masses Cardiac: RRR; no murmurs, rubs, or gallops, trace pitting edema lower extremities to the mid shins Respiratory:  clear to auscultation bilaterally, normal work of breathing GI: soft, nontender, nondistended, + BS MS: no deformity or atrophy  Skin: warm and dry, no rash Neuro:  Strength and sensation are intact Psych: euthymic mood, full affect   Recent Labs: No results found for requested labs within last 8760 hours.    Lipid Panel No results found for: CHOL, HDL, LDLCALC, TRIG    Wt Readings from Last 3 Encounters:  06/01/16 280 lb 12 oz (127.3 kg)  06/20/15 278 lb 12 oz (126.4 kg)  05/27/15 282 lb 8 oz (128.1 kg)       ASSESSMENT AND PLAN:  Atrial fibrillation with  RVR (HCC) - Plan: EKG 12-Lead Maintaining normal sinus rhythm, no changes to her medications  Systolic and diastolic CHF, acute (HCC) Blood pressure elevated, mild lower extremity edema. Recommended that she take Lasix 40 mill grams twice a day for several days  Cardiomyopathy, unspecified type [facility]) Previous echocardiogram September 2016 with ejection fraction 45-50% High risk of CHF. Stressed importance of decreasing her salt and fluid intake  Pure hypercholesterolemia Cholesterol is above goal Discussed possibly increasing her lovastatin  Essential hypertension Blood pressure markedly elevated on today's visit even on recheck Suspect secondary to fluid and salt retention, dietary indiscretion Recommended she increase her Lasix If no improvement in her blood pressure, would increase lisinopril up to 40 mg daily Recommended close monitoring of her blood pressure, call our office if this continues to run high  Type 2 diabetes mellitus with complication, without long-term current use of insulin ([place]) We have encouraged continued exercise,  careful diet management in an effort to lose weight.  Morbid obesity ([place]) Recommended low carbohydrate diet   Total encounter time more than 25 minutes  Greater than 50% was spent in counseling and coordination of care with the patient   Disposition:   F/U  6 months   Orders Placed This Encounter  Procedures  . EKG 12-Lead     Signed, Esmond Plants, M.D., Ph.D. 06/01/2016  [place], [place]

## 2016-06-01 NOTE — Patient Instructions (Addendum)
Medication Instructions:   Please take extra lasix up to twice a day for a few days as needed for high blood pressure  Take with extra potassium  Increase lisinopril to 40 mg daily if needed for high blood pressure  Monitor your blood pressure, Call if it continues to run high    Labwork:  No new labs needed  Testing/Procedures:  No further testing at this time   Follow-Up: It was a pleasure seeing you in the office today. Please call us if you have new issues that need to be addressed before your next appt.  [phone]  Your physician wants you to follow-up in: 6 months.  You will receive a reminder letter in the mail two months in advance. If you don't receive a letter, please call our office to schedule the follow-up appointment.  If you need a refill on your cardiac medications before your next appointment, please call your pharmacy.

## 2016-06-23 ENCOUNTER — Telehealth: Payer: Self-pay | Admitting: Cardiovascular Disease

## 2016-06-23 NOTE — Telephone Encounter (Signed)
Pt was advised at ov 06/01/16:  "Please take extra lasix up to twice a day for a few days as needed for high blood pressure Take with extra potassium Increase lisinopril to 40 mg daily if needed for high blood pressure Monitor your blood pressure, Call if it continues to run high"  Spoke w/ pt.  She reports that she is taking lisinopril 40 mg and is alternating lasix once daily and twice daily every other day.  She has restricted her fluids and eliminated fast foods in an effort to decrease her sodium intake.  She would like to know how to proceed, as her BP is not coming down.  Advised her that I will make Dr. Rockey Situ aware and call her back w/ his recommendation.

## 2016-06-23 NOTE — Telephone Encounter (Signed)
Pt is calling back, states her BP is still elevated. This a.m. 152/106 HR 74 Sat 10/28 190/108 HR 85, 181/121 HR 85

## 2016-06-23 NOTE — Telephone Encounter (Signed)
Several options for blood pressure Would try generic medication clonidine 0.1 mg twice a day Would continue to monitor pressure

## 2016-06-24 MED ORDER — CLONIDINE HCL 0.1 MG PO TABS: 0.1000 mg | ORAL_TABLET | Freq: Two times a day (BID) | ORAL | 0 refills | Status: DC

## 2016-06-24 MED ORDER — CLONIDINE HCL 0.1 MG PO TABS: 0.1000 mg | ORAL_TABLET | Freq: Two times a day (BID) | ORAL | 3 refills | Status: DC

## 2016-06-24 NOTE — Telephone Encounter (Signed)
Spoke with patient and reviewed Dr. Donivan Scull recommendations for medication and continued blood pressure monitoring. She wants to call her pharmacy and see how much it will cost before sending in prescription. She will call me back to let me know if I need to send in medication to her mail order pharmacy or local [place]. She verbalized understanding of our conversation and had no further questions at this time.

## 2016-06-24 NOTE — Telephone Encounter (Signed)
Spoke with patient and she requested that I send in 30 day prescription to [facility] and then 90 day supply to [facility]. Instructed her to continue monitoring blood pressures and to please give [place] a call if they remain elevated. She verbalized understanding of all instructions and had no further questions at this time.

## 2016-08-03 ENCOUNTER — Other Ambulatory Visit: Payer: Self-pay | Admitting: Cardiovascular Disease

## 2016-12-22 ENCOUNTER — Other Ambulatory Visit: Payer: Self-pay | Admitting: Nurse Practitioner

## 2016-12-22 DIAGNOSIS — Z1231 Encounter for screening mammogram for malignant neoplasm of breast: Secondary | ICD-10-CM

## 2016-12-23 ENCOUNTER — Encounter (INDEPENDENT_AMBULATORY_CARE_PROVIDER_SITE_OTHER): Payer: Self-pay

## 2016-12-23 ENCOUNTER — Ambulatory Visit
Admission: RE | Admit: 2016-12-23 | Discharge: 2016-12-23 | Disposition: A | Discharge status: Home / Self Care | Payer: Medicare Other | Source: Ambulatory Visit | Attending: Nurse Practitioner | Admitting: Nurse Practitioner

## 2016-12-23 DIAGNOSIS — Z1231 Encounter for screening mammogram for malignant neoplasm of breast: Secondary | ICD-10-CM | POA: Diagnosis not present

## 2017-03-12 ENCOUNTER — Telehealth: Payer: Self-pay | Admitting: Cardiovascular Disease

## 2017-03-12 NOTE — Telephone Encounter (Signed)
6 month fu per ckout 06/01/16 [place]  3 attempts to schedule fu appt from recall list.   Deleting recall.

## 2017-05-17 ENCOUNTER — Other Ambulatory Visit: Payer: Self-pay | Admitting: Cardiovascular Disease

## 2017-05-20 ENCOUNTER — Other Ambulatory Visit: Payer: Self-pay | Admitting: Cardiovascular Disease

## 2017-06-10 NOTE — Progress Notes (Signed)
Cardiology Office Note  Date:  06/11/2017   ID:  Paula Goodman, Paula Goodman Sep 08, 1945, MRN [id number]  PCP:  Sallee Lange, NP   Chief Complaint  Patient presents with  . other    6 month follow up. Pt. c/o shortness of breath, chest tightness and elevated blood pressure for the past couple of weeks.     HPI:  Paula Goodman  is a pleasant 71 year old woman with  morbid obesity,   admission to the hospital September 2016 with atrial flutter, difficulty in controlling her rate who underwent TEE and cardioversion, converted back to atrial flutter one week later.  diabetes type 2,  hyperlipidemia,  Hypertension Sleep apnea but does not use CPAP, too expensive Previously referred to [facility] for possible ablation, recommendation was made to try antiarrhythmic medication first She presents for routine follow-up for her atrial fibrillation/flutter.  In follow-up today she stopped her anticoagulation on her own No longer taking eliquis. Thought that she did not need it, felt her rhythm was normal  CHADS VASC of 5 Results discussed with her in detail, calculated pulled up on the computer for her review  Takes lasix once a day Sometimes extra 1/2 pill  SBP at 180/110  On a regular basis, did not call our office Taking extra lisinopril, sometimes 80 mg daily  Leg swelling has been stable, slightly worse on the right than the left secondary to previous trauma  Weight continues to run high   no recent lab work available  EKG on today's visit shows normal sinus rhythm with rate 74 bpm, no significant ST or T wave changes  Other past medical history Lasix increased to 40 mg daily on prior clinic visit, lab work checked at that time was essentially normal   PMH:   has a past medical history of Cardiomyopathy ([place]); Essential hypertension; Fibrocystic breast disease; GERD (gastroesophageal reflux disease); History of tobacco abuse; HLD (hyperlipidemia); Morbid obesity ([place]);  Paroxysmal atrial flutter ([place]); and Type II diabetes mellitus ([place]).  PSH:    Past Surgical History:  Procedure Laterality Date  . ABDOMINAL HYSTERECTOMY    . BREAST BIOPSY Right    neg  . ELECTROPHYSIOLOGIC STUDY N/A 04/26/2015   Procedure: CARDIOVERSION;  Surgeon: Minna Merritts, MD;  Location: [facility] ORS;  Service: Cardiovascular;  Laterality: N/A;  . TEE WITHOUT CARDIOVERSION N/A 04/26/2015   Procedure: TRANSESOPHAGEAL ECHOCARDIOGRAM (TEE);  Surgeon: Minna Merritts, MD;  Location: [facility] ORS;  Service: Cardiovascular;  Laterality: N/A;    Current Outpatient Prescriptions  Medication Sig Dispense Refill  . albuterol (PROVENTIL HFA;VENTOLIN HFA) 108 (90 BASE) MCG/ACT inhaler Inhale 2 puffs into the lungs every 6 (six) hours as needed for wheezing or shortness of breath.    . cloNIDine (CATAPRES) 0.2 MG tablet Take 1 tablet (0.2 mg total) by mouth 2 (two) times daily. 180 tablet 3  . colchicine 0.6 MG tablet Take 0.6 mg by mouth daily as needed. For gout flare up.    . flecainide (TAMBOCOR) 100 MG tablet TAKE 1 TABLET BY MOUTH TWO  TIMES DAILY 180 tablet 3  . furosemide (LASIX) 40 MG tablet Take 1 tablet (40 mg total) by mouth 2 (two) times daily as needed. 180 tablet 6  . gabapentin (NEURONTIN) 300 MG capsule Take 300 mg by mouth at bedtime.    Marland Kitchen glimepiride (AMARYL) 2 MG tablet Take 2 mg by mouth daily.  1  . glucose (SUNMARK GLUCOSE) 4 GM chewable tablet Chew 8 g by mouth daily as needed.  For low blood sugar.    . ketoconazole (NIZORAL) 2 % cream Apply 1 application topically 2 (two) times daily as needed. For rash    . lisinopril (PRINIVIL,ZESTRIL) 40 MG tablet TAKE 1 TABLET BY MOUTH  DAILY 90 tablet 0  . lovastatin (MEVACOR) 40 MG tablet Take 40 mg by mouth every evening. With dinner.  1  . metFORMIN (GLUCOPHAGE) 1000 MG tablet Take 1,000 mg by mouth 2 (two) times daily with a meal.  4  . metoprolol tartrate (LOPRESSOR) 25 MG tablet TAKE 1 TABLET BY MOUTH TWO  TIMES DAILY 180 tablet 3   . omeprazole (PRILOSEC) 20 MG capsule Take 20 mg by mouth daily.  1  . potassium chloride (K-DUR,KLOR-CON) 10 MEQ tablet Take 2 tablets (20 mEq total) by mouth 2 (two) times daily as needed. 180 tablet 3  . apixaban (ELIQUIS) 5 MG TABS tablet Take 1 tablet (5 mg total) by mouth 2 (two) times daily. 60 tablet 11  . isosorbide mononitrate (IMDUR) 30 MG 24 hr tablet Take 1 tablet (30 mg total) by mouth daily. 90 tablet 3   No current facility-administered medications for this visit.      Allergies:   Patient has no known allergies.   Social History:  The patient  reports that she quit smoking about 7 months ago. Her smoking use included Cigarettes. She has a 10.00 pack-year smoking history. She has never used smokeless tobacco. She reports that she does not drink alcohol or use drugs.   Family History:   family history includes Breast cancer (age of onset: 20) in her maternal aunt; Diabetes in her mother; Hypertension in her mother.    Review of Systems: Review of Systems  Constitutional: Negative.   Respiratory: Negative.   Cardiovascular: Positive for leg swelling.  Gastrointestinal: Negative.   Musculoskeletal: Negative.   Neurological: Negative.   Psychiatric/Behavioral: Negative.   All other systems reviewed and are negative.    PHYSICAL EXAM: VS:  BP (!) 182/110 (BP Location: Left Arm, Patient Position: Sitting, Cuff Size: Large)   Pulse 74   Ht 5\' 8"  (1.727 m)   Wt 279 lb 8 oz (126.8 kg)   BMI 42.50 kg/m  , BMI Body mass index is 42.5 kg/m. GEN: Well nourished, well developed, in no acute distress , obese HEENT: normal  Neck: no JVD, carotid bruits, or masses Cardiac: RRR; no murmurs, rubs, or gallops, trace pitting edema right lower extremity Respiratory:  clear to auscultation bilaterally, normal work of breathing GI: soft, nontender, nondistended, + BS MS: no deformity or atrophy  Skin: warm and dry, no rash Neuro:  Strength and sensation are intact Psych:  euthymic mood, full affect   Recent Labs: No results found for requested labs within last 8760 hours.    Lipid Panel No results found for: CHOL, HDL, LDLCALC, TRIG    Wt Readings from Last 3 Encounters:  06/11/17 279 lb 8 oz (126.8 kg)  06/01/16 280 lb 12 oz (127.3 kg)  06/20/15 278 lb 12 oz (126.4 kg)       ASSESSMENT AND PLAN:  Atrial fibrillation with RVR (HCC) - Plan: EKG 12-Lead Maintaining normal sinus rhythm,  Stopped anticoagulation on her own CHADS VASC 5.  Needs to be on anticoagulation, high risk of stroke 7-10 %/year Discussed with her in detail, she seems noncommittal We have given her paperwork for company assistance for eliquis 5 twice daily Coupon provided  Systolic and diastolic CHF, acute ([place]) Appears relatively euvolemic, will continue Lasix  40 daily with extra half pill in the afternoon as needed for leg swelling Stressed importance of better blood pressure control Also discussed sleep apnea with her  Cardiomyopathy, unspecified type [facility]) Previous echocardiogram September 2016 with ejection fraction 45-50% High risk of CHF.  Long discussion concerning her obstructive sleep apnea she does not have CPAP stressed importance of better blood pressure control, weight loss  Pure hypercholesterolemia  recommended she stay on lovastatin, lab work to primary care  Essential hypertension Blood pressure markedly elevated, Recommend she increase clonidine up to 0.2 mg twice daily We will avoid calcium channel blockers given her leg swelling Recommended if blood pressure continues to run high that we add isosorbide 30 mg daily  Type 2 diabetes mellitus with complication, without long-term current use of insulin ([place]) We have encouraged continued exercise, careful diet management in an effort to lose weight. Managed by primary care  Morbid obesity ([place]) Recommended low carbohydrate diet   Total encounter time more than 45 minutes  Greater than 50% was  spent in counseling and coordination of care with the patient   Disposition:   F/U  6 months   Orders Placed This Encounter  Procedures  . EKG 12-Lead     Signed, Esmond Plants, M.D., Ph.D. 06/11/2017  [place], [place]

## 2017-06-11 ENCOUNTER — Encounter: Payer: Self-pay | Admitting: Cardiovascular Disease

## 2017-06-11 ENCOUNTER — Ambulatory Visit (INDEPENDENT_AMBULATORY_CARE_PROVIDER_SITE_OTHER): Payer: Medicare Other | Admitting: Cardiovascular Disease

## 2017-06-11 VITALS — BP 182/110 | HR 74 | Ht 68.0 in | Wt 279.5 lb

## 2017-06-11 DIAGNOSIS — I4891 Unspecified atrial fibrillation: Secondary | ICD-10-CM | POA: Diagnosis not present

## 2017-06-11 DIAGNOSIS — I420 Dilated cardiomyopathy: Secondary | ICD-10-CM | POA: Diagnosis not present

## 2017-06-11 DIAGNOSIS — E118 Type 2 diabetes mellitus with unspecified complications: Secondary | ICD-10-CM | POA: Diagnosis not present

## 2017-06-11 DIAGNOSIS — E7800 Pure hypercholesterolemia, unspecified: Secondary | ICD-10-CM | POA: Diagnosis not present

## 2017-06-11 DIAGNOSIS — I10 Essential (primary) hypertension: Secondary | ICD-10-CM | POA: Diagnosis not present

## 2017-06-11 DIAGNOSIS — I5041 Acute combined systolic (congestive) and diastolic (congestive) heart failure: Secondary | ICD-10-CM | POA: Diagnosis not present

## 2017-06-11 DIAGNOSIS — I4892 Unspecified atrial flutter: Secondary | ICD-10-CM

## 2017-06-11 MED ORDER — CLONIDINE HCL 0.2 MG PO TABS: 0.2000 mg | ORAL_TABLET | Freq: Two times a day (BID) | ORAL | 3 refills | Status: DC

## 2017-06-11 MED ORDER — APIXABAN 5 MG PO TABS: 5.0000 mg | ORAL_TABLET | Freq: Two times a day (BID) | ORAL | 3 refills | Status: DC

## 2017-06-11 MED ORDER — ISOSORBIDE MONONITRATE ER 30 MG PO TB24: 30.0000 mg | ORAL_TABLET | Freq: Every day | ORAL | 3 refills | Status: DC

## 2017-06-11 MED ORDER — APIXABAN 5 MG PO TABS: 5.0000 mg | ORAL_TABLET | Freq: Two times a day (BID) | ORAL | 11 refills | Status: DC

## 2017-06-11 NOTE — Patient Instructions (Addendum)
Medication Instructions:   Please restart eliquis 5 mg twice a day for stroke prevention  Please increase the clonidine up to 0.2 mg twice a day  If blood pressure continues to run high, >140 on top number Start isosorbide one a day bat night  Labwork:  No new labs needed  Testing/Procedures:  No further testing at this time   Follow-Up: It was a pleasure seeing you in the office today. Please call us if you have new issues that need to be addressed before your next appt.  [phone]  Your physician wants you to follow-up in: 6 months.  You will receive a reminder letter in the mail two months in advance. If you don't receive a letter, please call our office to schedule the follow-up appointment.  If you need a refill on your cardiac medications before your next appointment, please call your pharmacy.

## 2017-07-20 ENCOUNTER — Other Ambulatory Visit: Payer: Self-pay | Admitting: Family Medicine

## 2017-07-20 ENCOUNTER — Ambulatory Visit
Admission: RE | Admit: 2017-07-20 | Discharge: 2017-07-20 | Disposition: A | Discharge status: Home / Self Care | Payer: Medicare Other | Source: Ambulatory Visit | Attending: Family Medicine | Admitting: Family Medicine

## 2017-07-20 DIAGNOSIS — M79661 Pain in right lower leg: Secondary | ICD-10-CM | POA: Diagnosis present

## 2017-07-20 DIAGNOSIS — L539 Erythematous condition, unspecified: Secondary | ICD-10-CM

## 2017-07-20 DIAGNOSIS — R609 Edema, unspecified: Secondary | ICD-10-CM

## 2017-08-21 ENCOUNTER — Other Ambulatory Visit: Payer: Self-pay | Admitting: Cardiovascular Disease

## 2017-11-19 ENCOUNTER — Other Ambulatory Visit: Payer: Self-pay | Admitting: Nurse Practitioner

## 2017-11-19 DIAGNOSIS — Z1231 Encounter for screening mammogram for malignant neoplasm of breast: Secondary | ICD-10-CM

## 2017-11-20 ENCOUNTER — Other Ambulatory Visit: Payer: Self-pay | Admitting: Cardiovascular Disease

## 2018-02-21 ENCOUNTER — Other Ambulatory Visit: Payer: Self-pay | Admitting: Cardiovascular Disease

## 2018-03-24 ENCOUNTER — Encounter: Payer: Self-pay | Admitting: *Deleted

## 2018-03-25 ENCOUNTER — Encounter
Admission: RE | Disposition: A | Discharge status: Home / Self Care | Payer: Self-pay | Source: Ambulatory Visit | Attending: Gastroenterology

## 2018-03-25 ENCOUNTER — Ambulatory Visit: Payer: Medicare Other | Admitting: Certified Registered Nurse Anesthetist

## 2018-03-25 ENCOUNTER — Encounter: Payer: Self-pay | Admitting: Anesthesiology

## 2018-03-25 ENCOUNTER — Ambulatory Visit
Admission: RE | Admit: 2018-03-25 | Discharge: 2018-03-25 | Disposition: A | Discharge status: Home / Self Care | Payer: Medicare Other | Source: Ambulatory Visit | Attending: Gastroenterology | Admitting: Gastroenterology

## 2018-03-25 DIAGNOSIS — K635 Polyp of colon: Secondary | ICD-10-CM | POA: Insufficient documentation

## 2018-03-25 DIAGNOSIS — Z6841 Body Mass Index (BMI) 40.0 and over, adult: Secondary | ICD-10-CM | POA: Diagnosis not present

## 2018-03-25 DIAGNOSIS — E119 Type 2 diabetes mellitus without complications: Secondary | ICD-10-CM | POA: Insufficient documentation

## 2018-03-25 DIAGNOSIS — I110 Hypertensive heart disease with heart failure: Secondary | ICD-10-CM | POA: Insufficient documentation

## 2018-03-25 DIAGNOSIS — Z79899 Other long term (current) drug therapy: Secondary | ICD-10-CM | POA: Insufficient documentation

## 2018-03-25 DIAGNOSIS — I509 Heart failure, unspecified: Secondary | ICD-10-CM | POA: Diagnosis not present

## 2018-03-25 DIAGNOSIS — I480 Paroxysmal atrial fibrillation: Secondary | ICD-10-CM | POA: Diagnosis not present

## 2018-03-25 DIAGNOSIS — D125 Benign neoplasm of sigmoid colon: Secondary | ICD-10-CM | POA: Diagnosis not present

## 2018-03-25 DIAGNOSIS — E785 Hyperlipidemia, unspecified: Secondary | ICD-10-CM | POA: Diagnosis not present

## 2018-03-25 DIAGNOSIS — Z1211 Encounter for screening for malignant neoplasm of colon: Secondary | ICD-10-CM | POA: Diagnosis not present

## 2018-03-25 DIAGNOSIS — K219 Gastro-esophageal reflux disease without esophagitis: Secondary | ICD-10-CM | POA: Diagnosis not present

## 2018-03-25 DIAGNOSIS — Z87891 Personal history of nicotine dependence: Secondary | ICD-10-CM | POA: Insufficient documentation

## 2018-03-25 DIAGNOSIS — Z7902 Long term (current) use of antithrombotics/antiplatelets: Secondary | ICD-10-CM | POA: Diagnosis not present

## 2018-03-25 DIAGNOSIS — Z7984 Long term (current) use of oral hypoglycemic drugs: Secondary | ICD-10-CM | POA: Insufficient documentation

## 2018-03-25 HISTORY — DX: Deficiency of other specified B group vitamins: E53.8

## 2018-03-25 HISTORY — DX: Unspecified osteoarthritis, unspecified site: M19.90

## 2018-03-25 HISTORY — DX: Heart failure, unspecified: I50.9

## 2018-03-25 HISTORY — PX: COLONOSCOPY WITH PROPOFOL: SHX5780

## 2018-03-25 HISTORY — DX: Unspecified atrial fibrillation: I48.91

## 2018-03-25 LAB — GLUCOSE, CAPILLARY: Glucose-Capillary: 212 mg/dL — ABNORMAL HIGH (ref 70–99)

## 2018-03-25 SURGERY — COLONOSCOPY WITH PROPOFOL
Anesthesia: General

## 2018-03-25 MED ORDER — SODIUM CHLORIDE 0.9 % IV SOLN
INTRAVENOUS | Status: DC
  Administered 2018-03-25: 1000 mL via INTRAVENOUS

## 2018-03-25 MED ORDER — PROPOFOL 10 MG/ML IV BOLUS
INTRAVENOUS | Status: DC | PRN
  Administered 2018-03-25: 70 mg via INTRAVENOUS

## 2018-03-25 MED ORDER — LIDOCAINE HCL (PF) 2 % IJ SOLN
INTRAMUSCULAR | Status: AC
  Filled 2018-03-25: qty 10

## 2018-03-25 MED ORDER — GLYCOPYRROLATE 0.2 MG/ML IJ SOLN
INTRAMUSCULAR | Status: AC
  Filled 2018-03-25: qty 1

## 2018-03-25 MED ORDER — SODIUM CHLORIDE 0.9 % IV SOLN: INTRAVENOUS | Status: DC

## 2018-03-25 MED ORDER — GLYCOPYRROLATE 0.2 MG/ML IJ SOLN
INTRAMUSCULAR | Status: DC | PRN
  Administered 2018-03-25: 0.1 mg via INTRAVENOUS

## 2018-03-25 MED ORDER — PROPOFOL 500 MG/50ML IV EMUL
INTRAVENOUS | Status: AC
  Filled 2018-03-25: qty 150

## 2018-03-25 MED ORDER — PROPOFOL 500 MG/50ML IV EMUL
INTRAVENOUS | Status: AC
  Filled 2018-03-25: qty 200

## 2018-03-25 MED ORDER — PROPOFOL 500 MG/50ML IV EMUL
INTRAVENOUS | Status: DC | PRN
  Administered 2018-03-25: 150 ug/kg/min via INTRAVENOUS

## 2018-03-25 NOTE — Anesthesia Postprocedure Evaluation (Signed)
Anesthesia Post Note  Patient: Paula Goodman  Procedure(s) Performed: COLONOSCOPY WITH PROPOFOL (N/A )  Patient location during evaluation: Endoscopy Anesthesia Type: General Level of consciousness: awake and alert Pain management: pain level controlled Vital Signs Assessment: post-procedure vital signs reviewed and stable Respiratory status: spontaneous breathing, nonlabored ventilation, respiratory function stable and patient connected to nasal cannula oxygen Cardiovascular status: blood pressure returned to baseline and stable Postop Assessment: no apparent nausea or vomiting Anesthetic complications: no     Last Vitals:  Vitals:   03/25/18 0810 03/25/18 0820  BP: 127/79 119/72  Pulse: 75 73  Resp: 12 (!) 22  Temp: (!) 36.4 C   SpO2: 98% 99%    Last Pain:  Vitals:   03/25/18 0810  TempSrc: Tympanic  PainSc:                  Dallys Nowakowski S

## 2018-03-25 NOTE — H&P (Addendum)
Outpatient short stay form Pre-procedure 03/25/2018 7:23 AM Lollie Sails MD  Primary Physician: Gaetano Net, NP  Reason for visit: Screening colonoscopy  History of present illness: Patient is a 72 year old female presenting today as above.  She has a no family history of colon cancer or colon polyps.  She does have a personal history of diabetes and does take metformin.  She has chronic loose stools. Tolerating her prep well.  She formerly took Eliquis has not taken that for a year.  She denies use of any aspirin products or other blood thinning agent.   Current Facility-Administered Medications:  .  0.9 %  sodium chloride infusion, , Intravenous, Continuous, Lollie Sails, MD .  0.9 %  sodium chloride infusion, , Intravenous, Continuous, Lollie Sails, MD  Medications Prior to Admission  Medication Sig Dispense Refill Last Dose  . albuterol (PROVENTIL HFA;VENTOLIN HFA) 108 (90 BASE) MCG/ACT inhaler Inhale 2 puffs into the lungs every 6 (six) hours as needed for wheezing or shortness of breath.   Past Month at Unknown time  . cloNIDine (CATAPRES) 0.2 MG tablet Take 1 tablet (0.2 mg total) by mouth 2 (two) times daily. 180 tablet 3 03/25/2018 at 0400  . flecainide (TAMBOCOR) 100 MG tablet TAKE 1 TABLET BY MOUTH TWO  TIMES DAILY 180 tablet 0 03/25/2018 at 0400  . furosemide (LASIX) 40 MG tablet Take 1 tablet (40 mg total) by mouth 2 (two) times daily as needed. 180 tablet 6 03/24/2018 at Unknown time  . gabapentin (NEURONTIN) 300 MG capsule Take 300 mg by mouth at bedtime.   03/25/2018 at 0400  . glimepiride (AMARYL) 2 MG tablet Take 2 mg by mouth daily.  1 03/24/2018 at Unknown time  . lisinopril (PRINIVIL,ZESTRIL) 40 MG tablet TAKE 1 TABLET BY MOUTH  DAILY 90 tablet 0 03/25/2018 at 0400  . lovastatin (MEVACOR) 40 MG tablet Take 40 mg by mouth every evening. With dinner.  1 03/24/2018 at Unknown time  . metFORMIN (GLUCOPHAGE) 1000 MG tablet Take 1,000 mg by mouth 2 (two) times daily with a  meal.  4 03/24/2018 at Unknown time  . metoprolol tartrate (LOPRESSOR) 25 MG tablet TAKE 1 TABLET BY MOUTH TWO  TIMES DAILY 180 tablet 3 03/24/2018 at Unknown time  . omeprazole (PRILOSEC) 20 MG capsule Take 20 mg by mouth daily.  1 03/24/2018 at Unknown time  . potassium chloride (K-DUR,KLOR-CON) 10 MEQ tablet Take 2 tablets (20 mEq total) by mouth 2 (two) times daily as needed. 180 tablet 3 03/24/2018 at Unknown time  . apixaban (ELIQUIS) 5 MG TABS tablet Take 1 tablet (5 mg total) by mouth 2 (two) times daily. (Patient not taking: Reported on 03/25/2018) 60 tablet 11 Not Taking at Unknown time  . colchicine 0.6 MG tablet Take 0.6 mg by mouth daily as needed. For gout flare up.   Not Taking at Unknown time  . flecainide (TAMBOCOR) 100 MG tablet TAKE 1 TABLET BY MOUTH TWO  TIMES DAILY 180 tablet 3 Taking  . flecainide (TAMBOCOR) 100 MG tablet TAKE 1 TABLET BY MOUTH TWO  TIMES DAILY 180 tablet 0   . flecainide (TAMBOCOR) 100 MG tablet TAKE 1 TABLET BY MOUTH TWO  TIMES DAILY 180 tablet 0   . glucose (SUNMARK GLUCOSE) 4 GM chewable tablet Chew 8 g by mouth daily as needed. For low blood sugar.   Taking  . isosorbide mononitrate (IMDUR) 30 MG 24 hr tablet Take 1 tablet (30 mg total) by mouth daily. (Patient not  taking: Reported on 03/25/2018) 90 tablet 3 Not Taking at Unknown time  . ketoconazole (NIZORAL) 2 % cream Apply 1 application topically 2 (two) times daily as needed. For rash   Taking     No Known Allergies   Past Medical History:  Diagnosis Date  . AF (atrial fibrillation) ([place])   . Arthritis   . B12 deficiency   . Cardiomyopathy ([place])    a. Presumed to be tachy-mediated in setting of atrial flutter;  b. 03/2015 Echo: Ef45-50%, no rwma, mild MR, mildly dil LA/RV.  Marland Kitchen CHF (congestive heart failure) ([place])   . Essential hypertension   . Fibrocystic breast disease   . GERD (gastroesophageal reflux disease)   . History of tobacco abuse   . HLD (hyperlipidemia)   . Morbid obesity ([place])   . Obesity    . Paroxysmal atrial flutter ([place])    a. Dx 03/2015. CHA2DS2VASc = 5-->Elquis;  b. 04/2015 TEE/DCCV; c. 05/07/2015 recurrent AFlutter.  . Type II diabetes mellitus ([place])     Review of systems:      Physical Exam    Heart and lungs: Regular rate and rhythm without rub or gallop, lungs are bilaterally clear.    HEENT: Normocephalic atraumatic eyes are anicteric    Other:    Pertinant exam for procedure: Soft nontender nondistended bowel sounds positive normoactive, obese.    Planned proceedures: Colonoscopy and indicated procedures.  Further discussion with patient she states that her diarrhea has been getting better more recently.    Lollie Sails, MD Gastroenterology 03/25/2018  7:23 AM

## 2018-03-25 NOTE — Op Note (Signed)
[facility] Gastroenterology Patient Name: Paula Goodman Procedure Date: 03/25/2018 7:44 AM MRN: [id number] Account #: [id number] Date of Birth: 09/24/1945 Admit Type: Outpatient Age: 72 Room: [facility] ENDO ROOM 3 Gender: Female Note Status: Finalized Procedure:            Colonoscopy Indications:          Screening for colorectal malignant neoplasm Providers:            Lollie Sails, MD Referring MD:         Juluis Rainier (Referring MD) Medicines:            Monitored Anesthesia Care Complications:        No immediate complications. Procedure:            Pre-Anesthesia Assessment:                       - ASA Grade Assessment: III - A patient with severe                        systemic disease.                       After obtaining informed consent, the colonoscope was                        passed under direct vision. Throughout the procedure,                        the patient's blood pressure, pulse, and oxygen                        saturations were monitored continuously. The                        Colonoscope was introduced through the anus and                        advanced to the the cecum, identified by appendiceal                        orifice and ileocecal valve. The colonoscopy was                        performed without difficulty. The patient tolerated the                        procedure. The quality of the bowel preparation was                        fair. Findings:      Many medium-mouthed diverticula were found in the sigmoid colon,       descending colon, transverse colon and ascending colon.      A 3 mm polyp was found in the sigmoid colon. The polyp was sessile. The       polyp was removed with a cold biopsy forceps. Resection and retrieval       were complete.      A 2 mm polyp was found in the proximal ascending colon. The polyp was       sessile. The polyp was removed with a cold biopsy forceps. Resection and  retrieval were  complete.      The retroflexed view of the distal rectum and anal verge was normal and       showed no anal or rectal abnormalities.      Skin tags were found on perianal exam. Impression:           - Preparation of the colon was fair.                       - Diverticulosis in the sigmoid colon, in the                        descending colon, in the transverse colon and in the                        ascending colon.                       - One 3 mm polyp in the sigmoid colon, removed with a                        cold biopsy forceps. Resected and retrieved.                       - One 2 mm polyp in the proximal ascending colon,                        removed with a cold biopsy forceps. Resected and                        retrieved. Recommendation:       - Discharge patient to home.                       - Await pathology results.                       - Telephone GI clinic for pathology results in 1 week. Procedure Code(s):    --- Professional ---                       [phone], Colonoscopy, flexible; with biopsy, single or                        multiple Diagnosis Code(s):    --- Professional ---                       Z12.11, Encounter for screening for malignant neoplasm                        of colon                       D12.5, Benign neoplasm of sigmoid colon                       D12.2, Benign neoplasm of ascending colon                       K57.30, Diverticulosis of large intestine without  perforation or abscess without bleeding CPT copyright 2017 American Medical Association. All rights reserved. The codes documented in this report are preliminary and upon coder review may  be revised to meet current compliance requirements. Lollie Sails, MD 03/25/2018 8:15:48 AM This report has been signed electronically. Number of Addenda: 0 Note Initiated On: 03/25/2018 7:44 AM Scope Withdrawal Time: 0 hours 8 minutes 59 seconds  Total Procedure Duration: 0 hours  22 minutes 56 seconds       [facility]

## 2018-03-25 NOTE — Anesthesia Preprocedure Evaluation (Addendum)
Anesthesia Evaluation  Patient identified by MRN, date of birth, ID band Patient awake    Reviewed: Allergy & Precautions, NPO status , Patient's Chart, lab work & pertinent test results, reviewed documented beta blocker date and time   Airway Mallampati: III  TM Distance: >3 FB     Dental  (+) Chipped, Partial Lower, Partial Upper   Pulmonary shortness of breath, former smoker,           Cardiovascular hypertension, Pt. on medications and Pt. on home beta blockers +CHF  + dysrhythmias Atrial Fibrillation      Neuro/Psych    GI/Hepatic GERD  ,  Endo/Other  diabetes, Type 2Morbid obesity  Renal/GU      Musculoskeletal  (+) Arthritis ,   Abdominal   Peds  Hematology   Anesthesia Other Findings Gout. Echo showed ef 50 2 yrs ago.  Reproductive/Obstetrics                            Anesthesia Physical Anesthesia Plan  ASA: III  Anesthesia Plan: General   Post-op Pain Management:    Induction: Intravenous  PONV Risk Score and Plan:   Airway Management Planned:   Additional Equipment:   Intra-op Plan:   Post-operative Plan:   Informed Consent: I have reviewed the patients History and Physical, chart, labs and discussed the procedure including the risks, benefits and alternatives for the proposed anesthesia with the patient or authorized representative who has indicated his/her understanding and acceptance.     Plan Discussed with: CRNA  Anesthesia Plan Comments:         Anesthesia Quick Evaluation

## 2018-03-25 NOTE — Anesthesia Procedure Notes (Signed)
Performed by: Sueann Brownley, CRNA Pre-anesthesia Checklist: Patient identified, Emergency Drugs available, Suction available, Patient being monitored and Timeout performed Patient Re-evaluated:Patient Re-evaluated prior to induction Oxygen Delivery Method: Nasal cannula Induction Type: IV induction       

## 2018-03-25 NOTE — Transfer of Care (Signed)
Immediate Anesthesia Transfer of Care Note  Patient: Paula Goodman  Procedure(s) Performed: COLONOSCOPY WITH PROPOFOL (N/A )  Patient Location: PACU  Anesthesia Type:General  Level of Consciousness: sedated  Airway & Oxygen Therapy: Patient Spontanous Breathing and Patient connected to nasal cannula oxygen  Post-op Assessment: Report given to RN and Post -op Vital signs reviewed and stable  Post vital signs: Reviewed and stable  Last Vitals:  Vitals Value Taken Time  BP 127/79 03/25/2018  8:10 AM  Temp 36.4 C 03/25/2018  8:10 AM  Pulse 74 03/25/2018  8:15 AM  Resp 28 03/25/2018  8:15 AM  SpO2 99 % 03/25/2018  8:15 AM  Vitals shown include unvalidated device data.  Last Pain:  Vitals:   03/25/18 0810  TempSrc: Tympanic  PainSc:          Complications: No apparent anesthesia complications

## 2018-03-25 NOTE — Anesthesia Post-op Follow-up Note (Signed)
Anesthesia QCDR form completed.        

## 2018-03-28 ENCOUNTER — Encounter: Payer: Self-pay | Admitting: Gastroenterology

## 2018-03-28 LAB — SURGICAL PATHOLOGY

## 2018-04-11 ENCOUNTER — Other Ambulatory Visit: Payer: Self-pay | Admitting: Cardiovascular Disease

## 2018-05-04 ENCOUNTER — Other Ambulatory Visit: Payer: Self-pay | Admitting: Cardiovascular Disease

## 2018-06-20 ENCOUNTER — Other Ambulatory Visit: Payer: Self-pay | Admitting: Cardiovascular Disease

## 2019-01-03 ENCOUNTER — Telehealth: Payer: Self-pay

## 2019-01-03 NOTE — Telephone Encounter (Signed)

## 2019-01-11 ENCOUNTER — Telehealth: Payer: Medicare Other | Admitting: Cardiovascular Disease

## 2019-01-24 ENCOUNTER — Telehealth: Payer: Self-pay

## 2019-01-24 NOTE — Telephone Encounter (Signed)
Called patient to confirm Telephone visit schedule for 01/31/2019.  Patient stated she DOES NOT want a telephone visit she wanted to be seen in person.  Made her aware that we are limiting patients coming into the office due to [place].  Patient stated to give her a call when Dr. Rockey Situ starts seeing patients in office

## 2019-01-31 ENCOUNTER — Telehealth: Payer: Medicare Other | Admitting: Cardiovascular Disease

## 2019-04-05 ENCOUNTER — Other Ambulatory Visit: Payer: Self-pay | Admitting: Cardiovascular Disease

## 2019-04-06 NOTE — Telephone Encounter (Signed)
Pt overdue for 6 month f/u. Pt hasn't been seen since 05/2017. Please contact pt for future appointment.

## 2019-04-11 NOTE — Telephone Encounter (Signed)
Patient was left voicemail to callback and schedule

## 2019-04-14 ENCOUNTER — Other Ambulatory Visit: Payer: Self-pay

## 2019-04-14 MED ORDER — FLECAINIDE ACETATE 100 MG PO TABS: 100.0000 mg | ORAL_TABLET | Freq: Two times a day (BID) | ORAL | 0 refills | Status: DC

## 2019-05-20 NOTE — Progress Notes (Addendum)
Cardiology Office Note  Date:  05/23/2019   ID:  Imahni, Odom 1946-03-21, MRN [id number]  PCP:  Sallee Lange, NP   Chief Complaint  Patient presents with  . other    Over due 6 month follow up. Patient c/o SOB and swelling in ankles. Meds reviewed verbally with patient.     HPI:  Paula Goodman  is a pleasant 73 year old woman with  morbid obesity,   admission to the hospital September 2016 with atrial flutter, difficulty in controlling her rate who underwent TEE and cardioversion, converted back to atrial flutter one week later.  diabetes type 2,  hyperlipidemia,  Hypertension Sleep apnea but does not use CPAP, too expensive Previously referred to [facility] for possible ablation, recommendation was made to try antiarrhythmic medication first She presents for routine follow-up for her atrial fibrillation/flutter.  In follow-up today weight is continuing to run high At home she is trying to to " fix diabetes" Has made some changes to her diet Increased water, cut carbs, portions, cut salt  Discussed medications with her Takes lasix 60 in AM Take lasix 20 mg Despite this she has leg edema High fluid intake, thinking it would help her urine output  Lab work reviewed HBA1C 10, unable to afford some of the expensive medications Total chol 154 Cr normal  Takes BP at home, pulse stable Does not feel that she is in and out of flutter Does not want anticoagulation Previously stopped her anticoagulation on her own No longer taking eliquis. CHADS VASC of 5  Some low Blood pressure , 123456 systolic at home, [id number]  EKG on today's visit shows normal sinus rhythm with rate 72 bpm, T wave abnormality V4 through V6, inferior leads,  PACs  Other past medical history Lasix increased to 40 mg daily on prior clinic visit, lab work checked at that time was essentially normal   PMH:   has a past medical history of AF (atrial fibrillation) ([place]), Arthritis, B12 deficiency,  Cardiomyopathy ([place]), CHF (congestive heart failure) ([place]), Essential hypertension, Fibrocystic breast disease, GERD (gastroesophageal reflux disease), History of tobacco abuse, HLD (hyperlipidemia), Morbid obesity ([place]), Obesity, Paroxysmal atrial flutter ([place]), and Type II diabetes mellitus ([place]).  PSH:    Past Surgical History:  Procedure Laterality Date  . ABDOMINAL HYSTERECTOMY    . BREAST BIOPSY Right    neg  . CARDIOVERSION    . COLONOSCOPY WITH PROPOFOL N/A 03/25/2018   Procedure: COLONOSCOPY WITH PROPOFOL;  Surgeon: Lollie Sails, MD;  Location[facility] ENDOSCOPY;  Service: Endoscopy;  Laterality: N/A;  . CYSTOSCOPY    . ELECTROPHYSIOLOGIC STUDY N/A 04/26/2015   Procedure: CARDIOVERSION;  Surgeon: Minna Merritts, MD;  Location: [facility] ORS;  Service: Cardiovascular;  Laterality: N/A;  . TEE WITHOUT CARDIOVERSION N/A 04/26/2015   Procedure: TRANSESOPHAGEAL ECHOCARDIOGRAM (TEE);  Surgeon: Minna Merritts, MD;  Location: [facility] ORS;  Service: Cardiovascular;  Laterality: N/A;    Current Outpatient Medications  Medication Sig Dispense Refill  . albuterol (PROVENTIL HFA;VENTOLIN HFA) 108 (90 BASE) MCG/ACT inhaler Inhale 2 puffs into the lungs every 6 (six) hours as needed for wheezing or shortness of breath.    . cloNIDine (CATAPRES) 0.2 MG tablet TAKE 1 TABLET BY MOUTH TWO  TIMES DAILY 180 tablet 0  . colchicine 0.6 MG tablet Take 0.6 mg by mouth daily as needed. For gout flare up.    . flecainide (TAMBOCOR) 100 MG tablet Take 1 tablet (100 mg total) by mouth 2 (two) times daily. [place]  tablet 0  . furosemide (LASIX) 40 MG tablet Take 1 tablet (40 mg total) by mouth 2 (two) times daily as needed. 180 tablet 6  . gabapentin (NEURONTIN) 300 MG capsule Take 300 mg by mouth at bedtime.    Marland Kitchen glimepiride (AMARYL) 2 MG tablet Take 2 mg by mouth daily.  1  . glucose (SUNMARK GLUCOSE) 4 GM chewable tablet Chew 8 g by mouth daily as needed. For low blood sugar.    . insulin glargine (LANTUS) 100  UNIT/ML injection Inject 40 Units into the skin daily.    Marland Kitchen ketoconazole (NIZORAL) 2 % cream Apply 1 application topically 2 (two) times daily as needed. For rash    . lisinopril (ZESTRIL) 40 MG tablet TAKE 1 TABLET BY MOUTH  DAILY 90 tablet 0  . lovastatin (MEVACOR) 40 MG tablet Take 40 mg by mouth every evening. With dinner.  1  . metFORMIN (GLUCOPHAGE) 1000 MG tablet Take 1,000 mg by mouth 2 (two) times daily with a meal.  4  . metoprolol tartrate (LOPRESSOR) 25 MG tablet TAKE 1 TABLET BY MOUTH TWO  TIMES DAILY 180 tablet 0  . omeprazole (PRILOSEC) 20 MG capsule Take 20 mg by mouth daily.  1  . potassium chloride (K-DUR,KLOR-CON) 10 MEQ tablet Take 2 tablets (20 mEq total) by mouth 2 (two) times daily as needed. 180 tablet 3   No current facility-administered medications for this visit.      Allergies:   Patient has no known allergies.   Social History:  The patient  reports that she quit smoking about 2 years ago. Her smoking use included cigarettes. She has a 10.00 pack-year smoking history. She has never used smokeless tobacco. She reports that she does not drink alcohol or use drugs.   Family History:   family history includes Breast cancer (age of onset: 82) in her maternal aunt; Diabetes in her mother; Hypertension in her mother.    Review of Systems: Review of Systems  Constitutional: Negative.   Respiratory: Negative.   Cardiovascular: Positive for leg swelling.  Gastrointestinal: Negative.   Musculoskeletal: Negative.   Neurological: Negative.   Psychiatric/Behavioral: Negative.   All other systems reviewed and are negative.    PHYSICAL EXAM: VS:  BP 120/70 (BP Location: Left Arm, Patient Position: Sitting, Cuff Size: Large)   Pulse 72   Ht 5' 7.5" (1.715 m)   Wt 276 lb (125.2 kg)   BMI 42.59 kg/m  , BMI Body mass index is 42.59 kg/m. GEN: Well nourished, well developed, in no acute distress , obese HEENT: normal  Neck: no JVD, carotid bruits, or  masses Cardiac: RRR; no murmurs, rubs, or gallops, trace pitting edema right lower extremity Respiratory:  clear to auscultation bilaterally, normal work of breathing GI: soft, nontender, nondistended, + BS MS: no deformity or atrophy  Skin: warm and dry, no rash Neuro:  Strength and sensation are intact Psych: euthymic mood, full affect  Recent Labs: No results found for requested labs within last 8760 hours.    Lipid Panel No results found for: CHOL, HDL, LDLCALC, TRIG    Wt Readings from Last 3 Encounters:  05/23/19 276 lb (125.2 kg)  03/25/18 280 lb (127 kg)  06/11/17 279 lb 8 oz (126.8 kg)     ASSESSMENT AND PLAN:  Atrial fibrillation with RVR (HCC) /atrial flutter- Plan: EKG 12-Lead Maintaining normal sinus rhythm,  Stopped anticoagulation on her own CHADS VASC 5. Again declining anticoagulation She will monitor pulse closely Previously in flutter  rate 95 up to 150 Stay on flecainide and metoprolol.  On the regiment appears to be maintaining normal sinus rhythm  Systolic and diastolic CHF, chronic (HCC) Taking Lasix 60 in the morning and 20 in the afternoon, still has leg edema High fluid intake Discussed CHF management with her, avoiding salt, decreasing her fluid intake, also discussed trying to get her weight down -If edema does not improve we have recommended she call us, we could change to torsemide --Suggested she increase Lasix up to 60 twice daily when she has significant swelling then back down to 60 in the morning 20 in the afternoon.  On a regular basis will take potassium 30 mEq daily  Cardiomyopathy, unspecified type [facility] [facility]) Previous echocardiogram September 2016 with ejection fraction 45-50% in the setting of atrial flutter Will need to consider repeat echocardiogram if unable to get edema back to baseline.  Appears to be maintaining normal sinus rhythm past several years  Pure hypercholesterolemia  Continue lovastatin  Essential hypertension Blood  pressure has been running low Potentially could decrease lisinopril down to 20 daily alternatively suggested she could decrease clonidine down to 0.1 twice daily  Type 2 diabetes mellitus with complication, without long-term current use of insulin (HCC) Hemoglobin A1c markedly elevated, 10 She has changed her diet, we have encouraged she continue attempt for weight loss  Morbid obesity (HCC) Recommended low carbohydrate diet Discussed in detail with her today   Total encounter time more than 45 minutes  Greater than 50% was spent in counseling and coordination of care with the patient   Disposition:   F/U  6 months   Orders Placed This Encounter  Procedures  . EKG 12-Lead     Signed, Esmond Plants, M.D., Ph.D. 05/23/2019  [place], [facility]

## 2019-05-23 ENCOUNTER — Encounter: Payer: Self-pay | Admitting: Cardiovascular Disease

## 2019-05-23 ENCOUNTER — Other Ambulatory Visit: Payer: Self-pay

## 2019-05-23 ENCOUNTER — Ambulatory Visit (INDEPENDENT_AMBULATORY_CARE_PROVIDER_SITE_OTHER): Payer: Medicare Other | Admitting: Cardiovascular Disease

## 2019-05-23 VITALS — BP 120/70 | HR 72 | Ht 67.5 in | Wt 276.0 lb

## 2019-05-23 DIAGNOSIS — I5041 Acute combined systolic (congestive) and diastolic (congestive) heart failure: Secondary | ICD-10-CM | POA: Diagnosis not present

## 2019-05-23 DIAGNOSIS — I4892 Unspecified atrial flutter: Secondary | ICD-10-CM

## 2019-05-23 DIAGNOSIS — I10 Essential (primary) hypertension: Secondary | ICD-10-CM

## 2019-05-23 DIAGNOSIS — E7800 Pure hypercholesterolemia, unspecified: Secondary | ICD-10-CM

## 2019-05-23 DIAGNOSIS — I420 Dilated cardiomyopathy: Secondary | ICD-10-CM | POA: Diagnosis not present

## 2019-05-23 DIAGNOSIS — I4891 Unspecified atrial fibrillation: Secondary | ICD-10-CM | POA: Diagnosis not present

## 2019-05-23 DIAGNOSIS — E118 Type 2 diabetes mellitus with unspecified complications: Secondary | ICD-10-CM

## 2019-05-23 MED ORDER — POTASSIUM CHLORIDE ER 10 MEQ PO TBCR: 10.0000 meq | EXTENDED_RELEASE_TABLET | Freq: Three times a day (TID) | ORAL | 2 refills | Status: DC

## 2019-05-23 MED ORDER — FUROSEMIDE 40 MG PO TABS: 40.0000 mg | ORAL_TABLET | Freq: Two times a day (BID) | ORAL | 2 refills | Status: DC

## 2019-05-23 NOTE — Patient Instructions (Addendum)
Medication Instructions:  Increase lasix up to 40 mg twice a day with extra 40 mg as needed daily for leg swelling  Cut fluid intake  Please increase the potassium up to 10 meq three a day  If you need a refill on your cardiac medications before your next appointment, please call your pharmacy.   Lab work: No new labs needed   If you have labs (blood work) drawn today and your tests are completely normal, you will receive your results only by: Marland Kitchen MyChart Message (if you have MyChart) OR . A paper copy in the mail If you have any lab test that is abnormal or we need to change your treatment, we will call you to review the results.   Testing/Procedures: No new testing needed   Follow-Up: At [facility], you and your health needs are our priority.  As part of our continuing mission to provide you with exceptional heart care, we have created designated Provider Care Teams.  These Care Teams include your primary Cardiologist (physician) and Advanced Practice Providers (APPs -  Physician Assistants and Nurse Practitioners) who all work together to provide you with the care you need, when you need it.  . You will need a follow up appointment in 6 months .   Please call our office 2 months in advance to schedule this appointment.    . Providers on your designated Care Team:   . Murray Hodgkins, NP . Christell Faith, PA-C . Marrianne Mood, PA-C  Any Other Special Instructions Will Be Listed Below (If Applicable).  For educational health videos Log in to : [email or web address] Or : [email or web address], password : [email or web address]

## 2019-06-02 ENCOUNTER — Other Ambulatory Visit: Payer: Self-pay | Admitting: Cardiovascular Disease

## 2020-01-05 ENCOUNTER — Other Ambulatory Visit: Payer: Self-pay | Admitting: Nurse Practitioner

## 2020-01-05 DIAGNOSIS — K219 Gastro-esophageal reflux disease without esophagitis: Secondary | ICD-10-CM

## 2020-01-05 DIAGNOSIS — R1011 Right upper quadrant pain: Secondary | ICD-10-CM

## 2020-01-05 DIAGNOSIS — R1013 Epigastric pain: Secondary | ICD-10-CM

## 2020-01-05 DIAGNOSIS — R748 Abnormal levels of other serum enzymes: Secondary | ICD-10-CM

## 2020-01-15 ENCOUNTER — Ambulatory Visit
Admission: RE | Admit: 2020-01-15 | Discharge: 2020-01-15 | Disposition: A | Discharge status: Home / Self Care | Payer: Medicare Other | Source: Ambulatory Visit | Attending: Nurse Practitioner | Admitting: Nurse Practitioner

## 2020-01-15 ENCOUNTER — Other Ambulatory Visit: Payer: Self-pay

## 2020-01-15 ENCOUNTER — Encounter (INDEPENDENT_AMBULATORY_CARE_PROVIDER_SITE_OTHER): Payer: Self-pay

## 2020-01-15 DIAGNOSIS — R1011 Right upper quadrant pain: Secondary | ICD-10-CM | POA: Diagnosis present

## 2020-01-15 DIAGNOSIS — K219 Gastro-esophageal reflux disease without esophagitis: Secondary | ICD-10-CM | POA: Diagnosis present

## 2020-01-15 DIAGNOSIS — R1013 Epigastric pain: Secondary | ICD-10-CM | POA: Insufficient documentation

## 2020-01-15 DIAGNOSIS — R748 Abnormal levels of other serum enzymes: Secondary | ICD-10-CM | POA: Diagnosis present

## 2020-01-19 ENCOUNTER — Other Ambulatory Visit: Payer: Self-pay | Admitting: Cardiovascular Disease

## 2020-04-11 ENCOUNTER — Other Ambulatory Visit: Payer: Self-pay | Admitting: Cardiovascular Disease

## 2020-06-11 ENCOUNTER — Other Ambulatory Visit: Payer: Self-pay | Admitting: Nurse Practitioner

## 2020-06-11 DIAGNOSIS — Z1231 Encounter for screening mammogram for malignant neoplasm of breast: Secondary | ICD-10-CM

## 2020-06-25 ENCOUNTER — Ambulatory Visit: Payer: Medicare Other

## 2020-07-04 ENCOUNTER — Other Ambulatory Visit: Payer: Self-pay | Admitting: Cardiovascular Disease

## 2020-07-05 ENCOUNTER — Ambulatory Visit
Admission: RE | Admit: 2020-07-05 | Discharge: 2020-07-05 | Disposition: A | Discharge status: Home / Self Care | Payer: Medicare Other | Source: Ambulatory Visit | Attending: Nurse Practitioner | Admitting: Nurse Practitioner

## 2020-07-05 ENCOUNTER — Other Ambulatory Visit: Payer: Self-pay

## 2020-07-05 DIAGNOSIS — Z1231 Encounter for screening mammogram for malignant neoplasm of breast: Secondary | ICD-10-CM | POA: Diagnosis not present

## 2020-10-03 ENCOUNTER — Other Ambulatory Visit: Payer: Self-pay | Admitting: Cardiovascular Disease

## 2020-10-03 NOTE — Telephone Encounter (Signed)
Rx request sent to pharmacy.  

## 2020-10-03 NOTE — Telephone Encounter (Signed)
Scheduled for 2:15

## 2020-10-03 NOTE — Telephone Encounter (Signed)
Please schedule overdue 6 month F/U appointment. Thank you! °

## 2020-10-07 NOTE — Progress Notes (Unsigned)
Cardiology Office Note  Date:  10/08/2020   ID:  Paula Goodman, Paula Goodman Feb 09, 1946, MRN [id number]  PCP:  Sallee Lange, NP   Chief Complaint  Patient presents with  . Other    OD 6 month f/u c/o sharp left side breast pain. Meds reviewed verbally with pt.    HPI:  Paula Goodman  is a pleasant 75 year old woman with  morbid obesity,   admission to the hospital September 2016 with atrial flutter, difficulty in controlling her rate who underwent TEE and cardioversion, converted back to atrial flutter one week later.  diabetes type 2,  hyperlipidemia,  Hypertension Sleep apnea but does not use CPAP, too expensive Previously referred to [facility] for possible ablation, recommendation was made to try antiarrhythmic medication first She presents for routine follow-up for her atrial fibrillation/flutter.  Last office visit with myself 2020 Since that time reports she has been doing relatively well, Feels she is remaining in normal sinus rhythm, does not check pulse or blood pressure at home Rare SOB spells,  Sits, recovers, 2-3 min Unclear if she is having tachypalpitations at this time  Left chest cramp Happens rarely,  Take potassium 3 a day Last potassium 3.7 through primary care May 2021, no recent lab work Declining lab work today  Lasix 60 mg am and 20 mg pm  Try to work on her diet HGBA1C 10.6 Always >10  Does some walking down the block when the weather is good Some leg weakness  Changed several of her medications on her own Felt she was having low blood pressure Had episode of orthostasis/dizziness Stopped clonidine in the pm, only taking clonidine 0 point 2 in the AM Cut lisinipril down in 1/2  Previously stopped anticoagulation on her own Did not want to restart No longer taking eliquis. CHADS VASC of 5  EKG on today's visit shows normal sinus rhythm with rate 63 bpm,  No significant ST-T wave changes  Other past medical history Lasix increased to 40  mg daily on prior clinic visit, lab work checked at that time was essentially normal   PMH:   has a past medical history of AF (atrial fibrillation) ([place]), Arthritis, B12 deficiency, Cardiomyopathy ([place]), CHF (congestive heart failure) ([place]), Essential hypertension, Fibrocystic breast disease, GERD (gastroesophageal reflux disease), History of tobacco abuse, HLD (hyperlipidemia), Morbid obesity ([place]), Obesity, Paroxysmal atrial flutter ([place]), and Type II diabetes mellitus ([place]).  PSH:    Past Surgical History:  Procedure Laterality Date  . ABDOMINAL HYSTERECTOMY    . BREAST BIOPSY Right    neg  . CARDIOVERSION    . COLONOSCOPY WITH PROPOFOL N/A 03/25/2018   Procedure: COLONOSCOPY WITH PROPOFOL;  Surgeon: Lollie Sails, MD;  [facility] ENDOSCOPY;  Service: Endoscopy;  Laterality: N/A;  . CYSTOSCOPY    . ELECTROPHYSIOLOGIC STUDY N/A 04/26/2015   Procedure: CARDIOVERSION;  Surgeon: Minna Merritts, MD;  Location: [facility] ORS;  Service: Cardiovascular;  Laterality: N/A;  . TEE WITHOUT CARDIOVERSION N/A 04/26/2015   Procedure: TRANSESOPHAGEAL ECHOCARDIOGRAM (TEE);  Surgeon: Minna Merritts, MD;  Location: [facility] ORS;  Service: Cardiovascular;  Laterality: N/A;    Current Outpatient Medications  Medication Sig Dispense Refill  . albuterol (PROVENTIL HFA;VENTOLIN HFA) 108 (90 BASE) MCG/ACT inhaler Inhale 2 puffs into the lungs every 6 (six) hours as needed for wheezing or shortness of breath.    . cloNIDine (CATAPRES) 0.2 MG tablet TAKE 1 TABLET BY MOUTH  TWICE DAILY (Patient taking differently: Take 0.2 mg by mouth daily.) 180  tablet 3  . colchicine 0.6 MG tablet Take 0.6 mg by mouth daily as needed. For gout flare up.    . flecainide (TAMBOCOR) 100 MG tablet Take 1 tablet (100 mg total) by mouth 2 (two) times daily. 180 tablet 0  . furosemide (LASIX) 40 MG tablet TAKE 1 TABLET BY MOUTH  TWICE DAILY TAKE ADDITIONAL 40MG  AS NEEDED DAILY FOR  LEG SWELLING 270 tablet 0  . gabapentin (NEURONTIN)  300 MG capsule Take 900 mg by mouth 2 (two) times daily.    Marland Kitchen glucose 4 GM chewable tablet Chew 8 g by mouth daily as needed. For low blood sugar.    . insulin detemir (LEVEMIR FLEXTOUCH) 100 UNIT/ML FlexPen daily.    . insulin glargine (LANTUS) 100 UNIT/ML injection Inject into the skin daily. Sliding scale    . ketoconazole (NIZORAL) 2 % cream Apply 1 application topically 2 (two) times daily as needed. For rash    . lisinopril (ZESTRIL) 40 MG tablet TAKE 1 TABLET BY MOUTH  DAILY (Patient taking differently: Take 0.5 mg by mouth daily.) 90 tablet 3  . lovastatin (MEVACOR) 40 MG tablet Take 40 mg by mouth every evening. With dinner.  1  . metoprolol tartrate (LOPRESSOR) 25 MG tablet TAKE 1 TABLET BY MOUTH  TWICE DAILY 180 tablet 3  . omeprazole (PRILOSEC) 20 MG capsule Take 20 mg by mouth daily.  1  . potassium chloride (K-DUR,KLOR-CON) 10 MEQ tablet Take 2 tablets (20 mEq total) by mouth 2 (two) times daily as needed. (Patient taking differently: Takes 2 tablets am and 1 tablet pm daily.) 180 tablet 3   No current facility-administered medications for this visit.     Allergies:   Patient has no known allergies.   Social History:  The patient  reports that she has been smoking cigarettes. She has a 10.00 pack-year smoking history. She has never used smokeless tobacco. She reports that she does not drink alcohol and does not use drugs.   Family History:   family history includes Breast cancer (age of onset: 58) in her maternal aunt; Diabetes in her mother; Hypertension in her mother.    Review of Systems: Review of Systems  Constitutional: Negative.   HENT: Negative.   Respiratory: Negative.   Cardiovascular: Negative.   Gastrointestinal: Negative.   Musculoskeletal: Negative.   Neurological: Negative.   Psychiatric/Behavioral: Negative.   All other systems reviewed and are negative.   PHYSICAL EXAM: VS:  BP 140/82 (BP Location: Left Arm, Patient Position: Sitting, Cuff Size:  Large)   Pulse 63   Ht 5' 7.5" (1.715 m)   Wt 272 lb 6 oz (123.5 kg)   SpO2 97%   BMI 42.03 kg/m  , BMI Body mass index is 42.03 kg/m. Constitutional:  oriented to person, place, and time. No distress.  HENT:  Head: Grossly normal Eyes:  no discharge. No scleral icterus.  Neck: No JVD, no carotid bruits  Cardiovascular: Regular rate and rhythm, no murmurs appreciated Pulmonary/Chest: Clear to auscultation bilaterally, no wheezes or rails Abdominal: Soft.  no distension.  no tenderness.  Musculoskeletal: Normal range of motion Neurological:  normal muscle tone. Coordination normal. No atrophy Skin: Skin warm and dry Psychiatric: normal affect, pleasant  Recent Labs: No results found for requested labs within last 8760 hours.    Lipid Panel No results found for: CHOL, HDL, LDLCALC, TRIG    Wt Readings from Last 3 Encounters:  10/08/20 272 lb 6 oz (123.5 kg)  05/23/19 276 lb (125.2  kg)  03/25/18 280 lb (127 kg)     ASSESSMENT AND PLAN:  Atrial fibrillation with RVR (HCC) /atrial flutter- Plan: EKG 12-Lead Maintaining normal sinus rhythm,  Stopped anticoagulation in the past on her own CHADS VASC 5.  declining anticoagulation We recommend she monitor her pulse rate at home when she has episodes of shortness of breath Previously in flutter rate 95 up to 150 Stay on flecainide and metoprolol.  Appears to be doing relatively well on this regimen No significant QRS prolongation  Systolic and diastolic CHF, chronic (HCC) Taking Lasix 60 in the morning and 20 in the afternoon,  Minimal leg edema, overall appears relatively euvolemic Blood pressure stable, no changes to her medications  Cardiomyopathy, unspecified type [facility]) Previous echocardiogram September 2016 with ejection fraction 45-50% in the setting of atrial flutter Repeating a echocardiogram has been discussed with her on prior office visits  Pure hypercholesterolemia  Continue lovastatin Lipid panel through  primary care  Essential hypertension We changed her clonidine down to 0.1 twice daily rather than 0.2 once a day She is taking lisinopril 20 daily Blood pressure stable, previously with orthostasis symptoms  Type 2 diabetes mellitus with complication, without long-term current use of insulin (HCC) Hemoglobin A1c markedly elevated, 10 Again we discussed diet with her, recommended regular walking program, avoiding high carbohydrate foods  Morbid obesity (HCC) A1c consistently greater than 10 Low carbohydrates discussed with her We will likely need additional medications, will defer to primary care May need to work with endocrine   No orders of the defined types were placed in this encounter.    Signed, Esmond Plants, M.D., Ph.D. 10/08/2020  [place], [place]

## 2020-10-08 ENCOUNTER — Other Ambulatory Visit: Payer: Self-pay

## 2020-10-08 ENCOUNTER — Encounter: Payer: Self-pay | Admitting: Cardiovascular Disease

## 2020-10-08 ENCOUNTER — Ambulatory Visit: Payer: Medicare Other | Admitting: Cardiovascular Disease

## 2020-10-08 VITALS — BP 140/82 | HR 63 | Ht 67.5 in | Wt 272.4 lb

## 2020-10-08 DIAGNOSIS — I10 Essential (primary) hypertension: Secondary | ICD-10-CM

## 2020-10-08 DIAGNOSIS — E118 Type 2 diabetes mellitus with unspecified complications: Secondary | ICD-10-CM

## 2020-10-08 DIAGNOSIS — E7800 Pure hypercholesterolemia, unspecified: Secondary | ICD-10-CM

## 2020-10-08 DIAGNOSIS — I4892 Unspecified atrial flutter: Secondary | ICD-10-CM

## 2020-10-08 DIAGNOSIS — I5041 Acute combined systolic (congestive) and diastolic (congestive) heart failure: Secondary | ICD-10-CM | POA: Diagnosis not present

## 2020-10-08 DIAGNOSIS — I4891 Unspecified atrial fibrillation: Secondary | ICD-10-CM | POA: Diagnosis not present

## 2020-10-08 DIAGNOSIS — I420 Dilated cardiomyopathy: Secondary | ICD-10-CM

## 2020-10-08 MED ORDER — CLONIDINE HCL 0.1 MG PO TABS: 0.1000 mg | ORAL_TABLET | Freq: Two times a day (BID) | ORAL | 3 refills | Status: DC

## 2020-10-08 NOTE — Patient Instructions (Signed)
Medication Instructions:  Please change clonidine to 0.1 mg twice a day  If you need a refill on your cardiac medications before your next appointment, please call your pharmacy.    Lab work: No new labs needed   If you have labs (blood work) drawn today and your tests are completely normal, you will receive your results only by: Marland Kitchen MyChart Message (if you have MyChart) OR . A paper copy in the mail If you have any lab test that is abnormal or we need to change your treatment, we will call you to review the results.   Testing/Procedures: No new testing needed   Follow-Up: At [facility], you and your health needs are our priority.  As part of our continuing mission to provide you with exceptional heart care, we have created designated Provider Care Teams.  These Care Teams include your primary Cardiologist (physician) and Advanced Practice Providers (APPs -  Physician Assistants and Nurse Practitioners) who all work together to provide you with the care you need, when you need it.  . You will need a follow up appointment in 12 months  . Providers on your designated Care Team:   . Murray Hodgkins, NP . Christell Faith, PA-C . Marrianne Mood, PA-C  Any Other Special Instructions Will Be Listed Below (If Applicable).  COVID-19 Vaccine Information can be found at: ShippingScam.co.uk For questions related to vaccine distribution or appointments, please email [email or web address] or call [phone].

## 2021-05-14 ENCOUNTER — Other Ambulatory Visit: Payer: Self-pay | Admitting: Cardiovascular Disease

## 2021-06-03 ENCOUNTER — Other Ambulatory Visit: Payer: Self-pay | Admitting: Cardiovascular Disease

## 2021-07-08 ENCOUNTER — Emergency Department: Payer: Medicare Other

## 2021-07-08 ENCOUNTER — Emergency Department
Admission: EM | Admit: 2021-07-08 | Discharge: 2021-07-08 | Disposition: A | Discharge status: Home / Self Care | Payer: Medicare Other | Attending: Emergency Medicine | Admitting: Emergency Medicine

## 2021-07-08 ENCOUNTER — Other Ambulatory Visit: Payer: Self-pay

## 2021-07-08 DIAGNOSIS — R531 Weakness: Secondary | ICD-10-CM | POA: Diagnosis present

## 2021-07-08 DIAGNOSIS — R509 Fever, unspecified: Secondary | ICD-10-CM | POA: Diagnosis not present

## 2021-07-08 DIAGNOSIS — Z5321 Procedure and treatment not carried out due to patient leaving prior to being seen by health care provider: Secondary | ICD-10-CM | POA: Insufficient documentation

## 2021-07-08 DIAGNOSIS — R42 Dizziness and giddiness: Secondary | ICD-10-CM | POA: Diagnosis not present

## 2021-07-08 LAB — CBG MONITORING, ED: Glucose-Capillary: 120 mg/dL — ABNORMAL HIGH (ref 70–99)

## 2021-07-08 LAB — COMPREHENSIVE METABOLIC PANEL
ALT: 18 U/L (ref 0–44)
AST: 28 U/L (ref 15–41)
Albumin: 4 g/dL (ref 3.5–5.0)
Alkaline Phosphatase: 109 U/L (ref 38–126)
Anion gap: 12 (ref 5–15)
BUN: 9 mg/dL (ref 8–23)
CO2: 27 mmol/L (ref 22–32)
Calcium: 9 mg/dL (ref 8.9–10.3)
Chloride: 100 mmol/L (ref 98–111)
Creatinine, Ser: 0.95 mg/dL (ref 0.44–1.00)
GFR, Estimated: >60 mL/min (ref >60)
Glucose, Bld: 131 mg/dL — ABNORMAL HIGH (ref 70–99)
Potassium: 3.4 mmol/L — ABNORMAL LOW (ref 3.5–5.1)
Sodium: 139 mmol/L (ref 135–145)
Total Bilirubin: 1 mg/dL (ref 0.3–1.2)
Total Protein: 7.8 g/dL (ref 6.5–8.1)

## 2021-07-08 LAB — CBC
HCT: 47.7 % — ABNORMAL HIGH (ref 36.0–46.0)
Hemoglobin: 15.7 g/dL — ABNORMAL HIGH (ref 12.0–15.0)
MCH: 30.1 pg (ref 26.0–34.0)
MCHC: 32.9 g/dL (ref 30.0–36.0)
MCV: 91.6 fL (ref 80.0–100.0)
Platelets: 180 10*3/uL (ref 150–400)
RBC: 5.21 MIL/uL — ABNORMAL HIGH (ref 3.87–5.11)
RDW: 13.6 % (ref 11.5–15.5)
WBC: 10.4 10*3/uL (ref 4.0–10.5)
nRBC: 0 % (ref 0.0–0.2)

## 2021-07-08 NOTE — ED Triage Notes (Signed)
Pt comes into the ED via EMs from home , states she got dizzy and fell to the floor around 10am today and was found by family this afternoon around 4pm.  Pt had Covid and flu shots 2 days ago.  101 temp 30-36RR 80HR 95%RA 150/80 [id number]

## 2021-07-08 NOTE — ED Provider Notes (Signed)
Emergency Medicine Provider Triage Evaluation Note  Paula Goodman , a 75 y.o. female  was evaluated in triage.  Pt complains of dizziness and recent fall.  Patient states that she was walking from the bathroom to her bedroom when she felt as though she was walking to the side.  She states that she lost her balance and fell.  No blood thinner usage.  Review of Systems  Positive: Patient has dizziness Negative: No chest pain or abdominal pain.   Physical Exam  BP (!) 143/73   Pulse 86   Temp 100.2 F (37.9 C) (Oral)   Resp 18   SpO2 96%  Gen:   Awake, no distress   Resp:  Normal effort  MSK:   Moves extremities without difficulty  Other:    Medical Decision Making  Medically screening exam initiated at 5:42 PM.  Appropriate orders placed.  Jalecia Leon Somers was informed that the remainder of the evaluation will be completed by another provider, this initial triage assessment does not replace that evaluation, and the importance of remaining in the ED until their evaluation is complete.     Vallarie Mare [place], PA-C 07/08/21 1744    Blake Divine, MD 07/08/21 2123

## 2021-11-06 ENCOUNTER — Telehealth: Payer: Self-pay | Admitting: Cardiovascular Disease

## 2021-11-06 NOTE — Telephone Encounter (Signed)
-----   Message from Horton Finer sent at 11/06/2021  3:22 PM EDT ----- ?Regarding: needs appt ?Please schedule overdue office visit for 90 day refills. Thank you! ? ?

## 2021-11-06 NOTE — Telephone Encounter (Signed)
No answer NO VM  ?

## 2021-12-02 ENCOUNTER — Telehealth: Payer: Self-pay

## 2021-12-02 NOTE — Telephone Encounter (Signed)
-----   Message from Horton Finer sent at 11/06/2021  3:22 PM EDT ----- Regarding: needs appt Please schedule overdue office visit for 90 day refills. Thank you!

## 2021-12-02 NOTE — Telephone Encounter (Signed)
Left msg to call back and schedule over due follow up appointment

## 2021-12-10 NOTE — Telephone Encounter (Signed)
Patient not available daughter will help schedule. Will call back  ?

## 2021-12-19 ENCOUNTER — Ambulatory Visit: Payer: Medicare Other | Admitting: Nurse Practitioner

## 2021-12-19 NOTE — Telephone Encounter (Signed)
Attempted to schedule.  

## 2022-01-09 ENCOUNTER — Other Ambulatory Visit: Payer: Self-pay | Admitting: Cardiovascular Disease

## 2022-01-12 NOTE — Telephone Encounter (Signed)
Please contact pt for future appointment. Pt overdue needing refills.

## 2022-01-12 NOTE — Telephone Encounter (Signed)
Attempted to schedule.  

## 2022-01-13 NOTE — Telephone Encounter (Signed)
Patient will talk to daughter and call back with schedule availability

## 2022-01-22 ENCOUNTER — Other Ambulatory Visit: Payer: Self-pay

## 2022-01-22 MED ORDER — CLONIDINE HCL 0.1 MG PO TABS: 0.1000 mg | ORAL_TABLET | Freq: Two times a day (BID) | ORAL | 3 refills | Status: DC

## 2022-03-09 NOTE — Telephone Encounter (Signed)
Scheduled

## 2022-03-10 MED ORDER — METOPROLOL TARTRATE 25 MG PO TABS: 25.0000 mg | ORAL_TABLET | Freq: Two times a day (BID) | ORAL | 0 refills | Status: DC

## 2022-03-10 NOTE — Addendum Note (Signed)
Addended by: Britt Bottom on: 03/10/2022 09:09 AM   Modules accepted: Orders

## 2022-03-10 NOTE — Telephone Encounter (Signed)
Requested Prescriptions   Signed Prescriptions Disp Refills  . metoprolol tartrate (LOPRESSOR) 25 MG tablet 180 tablet 0    Sig: Take 1 tablet (25 mg total) by mouth 2 (two) times daily.    Authorizing Provider: GOLLAN, TIMOTHY J    Ordering User: Yecenia Dalgleish C    

## 2022-03-16 IMAGING — CT CT HEAD W/O CM
4 series · 16 of 47 positions shown, 18 images · non-contrast
Comparison: None.

CLINICAL DATA: Status post fall.

EXAM:
CT HEAD WITHOUT CONTRAST
TECHNIQUE: Contiguous axial images were obtained from the base of the skull
through the vertex without intravenous contrast.

[Series 2: head bone · axial · 0.46mm/px · z∈[-79,-29]mm · 4 of 76 slices shown]
[im 8/76  bone]
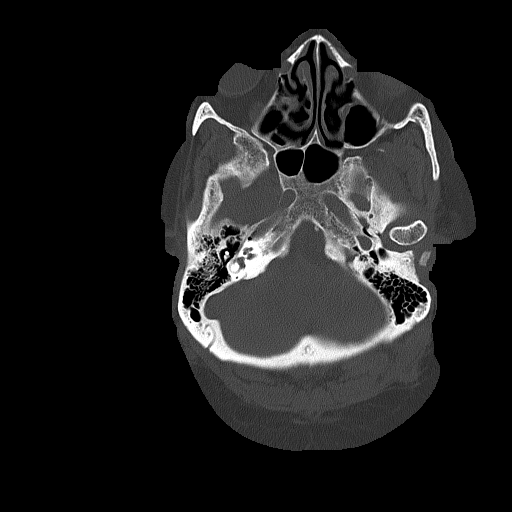
[im 15/76  bone]
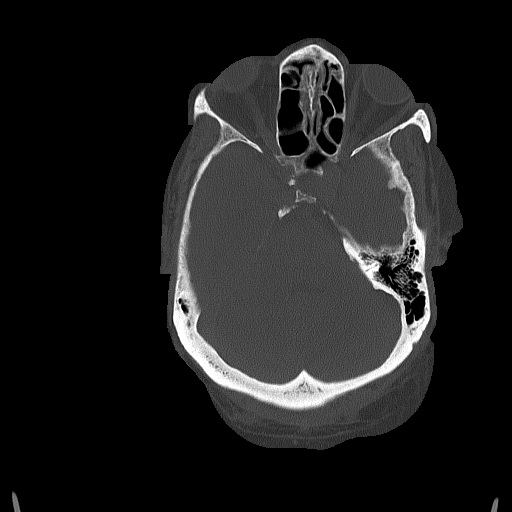
[im 26/76  bone]
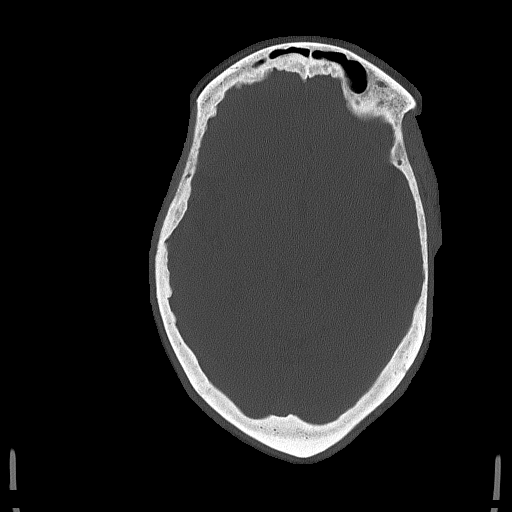
[im 33/76  bone]
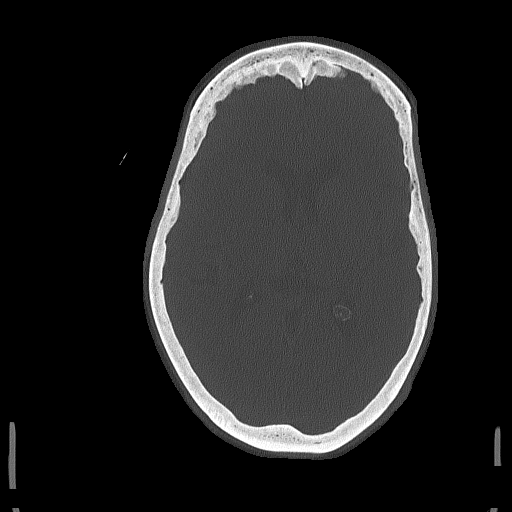

[Series 3: head wo · axial · 0.46mm/px · z∈[-73,+27]mm · 6 of 29 slices shown, 8 images]
[im 5/29  brain]
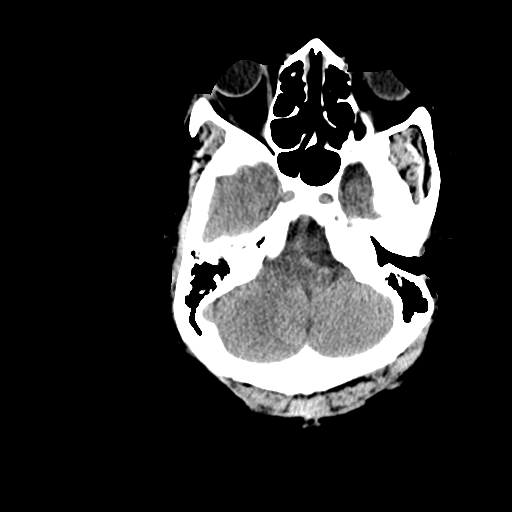
[im 5/29  bone]
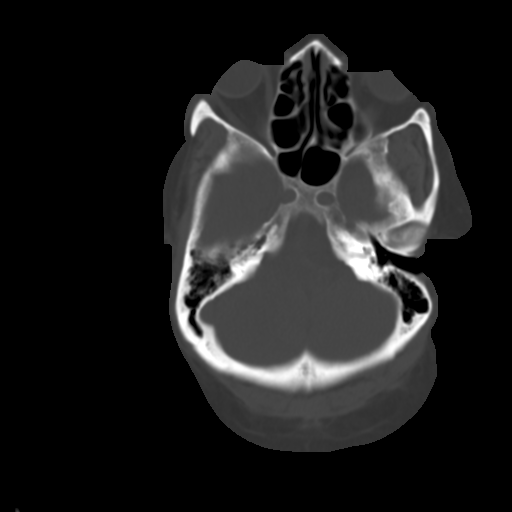
[im 9/29  brain]
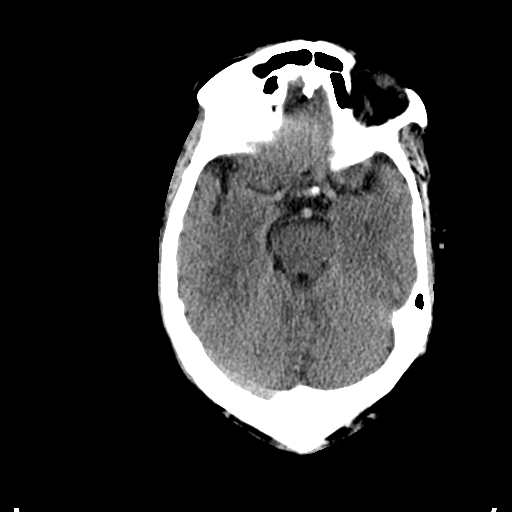
[im 13/29  brain]
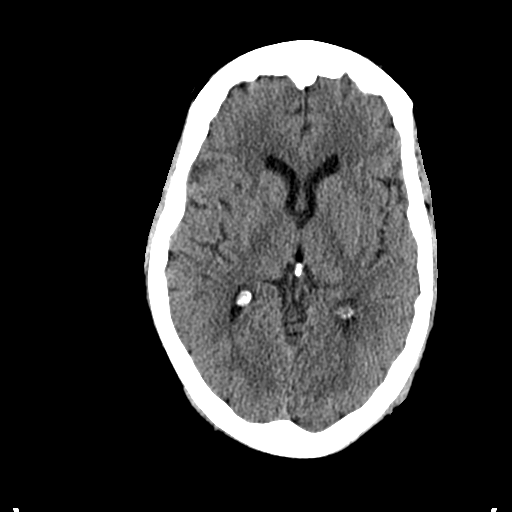
[im 17/29  brain]
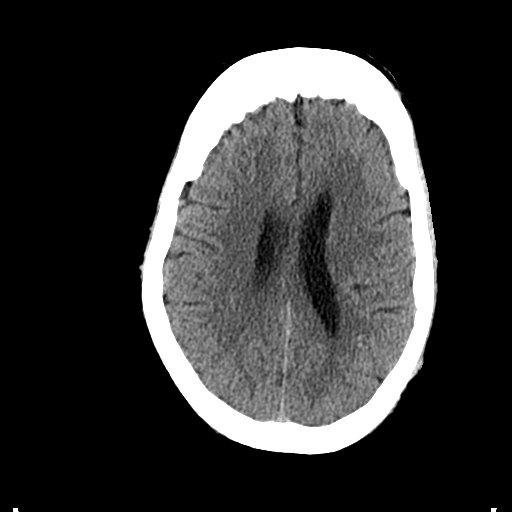
[im 21/29  brain]
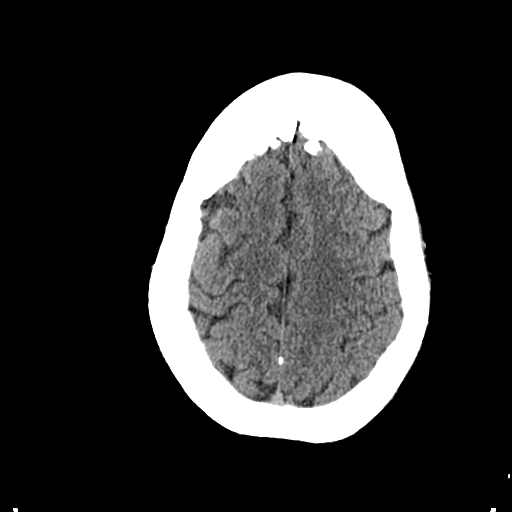
[im 21/29  bone]
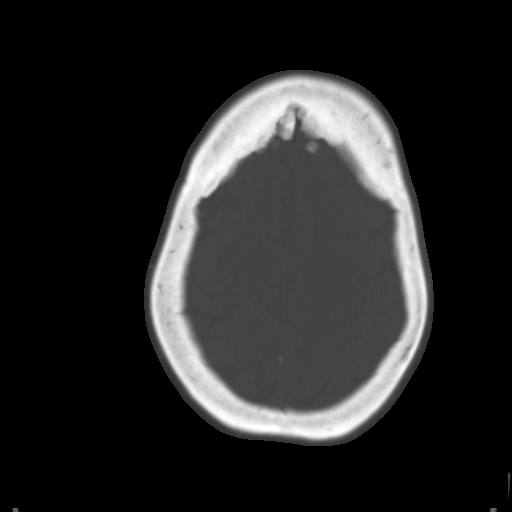
[im 25/29  brain]
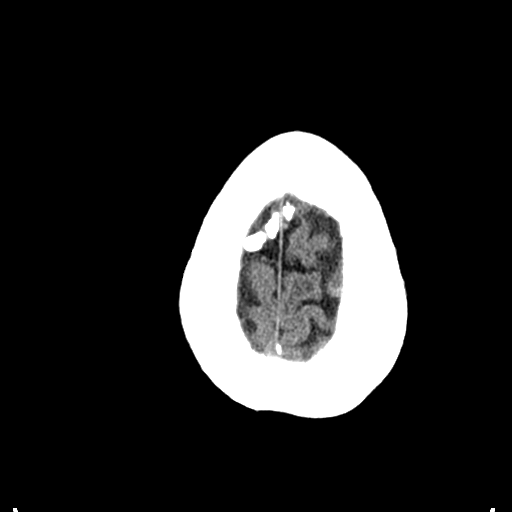

[Series 4: coronal soft tissue · coronal · 0.31mm/px · 3 of 70 slices shown]
[im 24/70  brain]
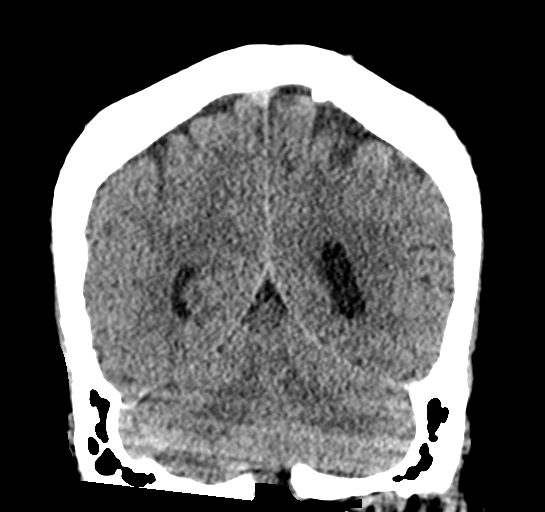
[im 31/70  brain]
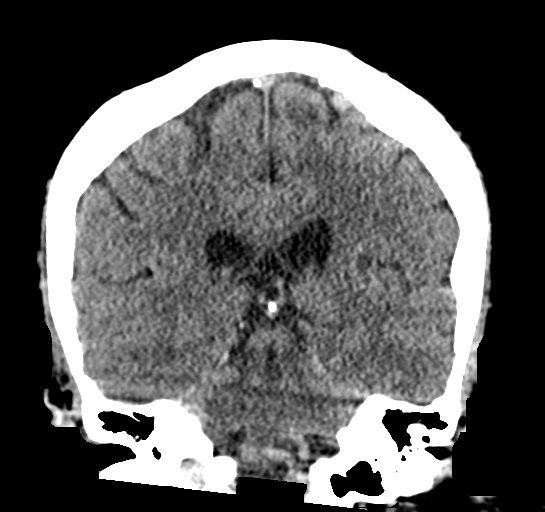
[im 39/70  brain]
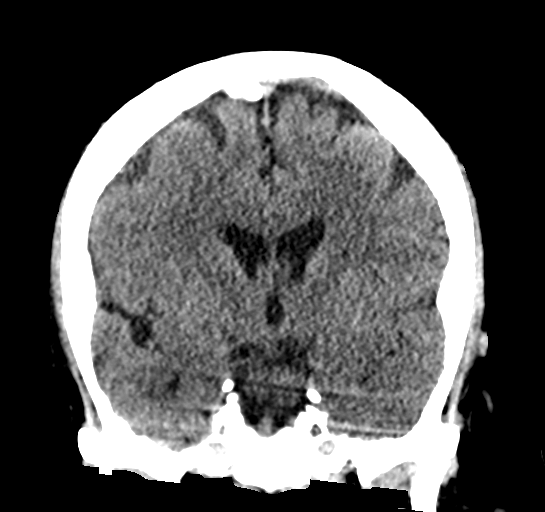

[Series 5: sagittal soft tissue · sagittal · 0.31mm/px · 3 of 56 slices shown]
[im 19/56  brain]
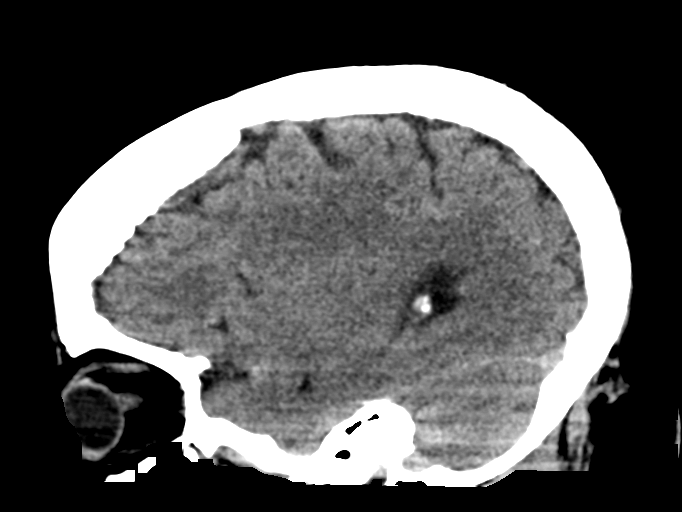
[im 28/56  brain]
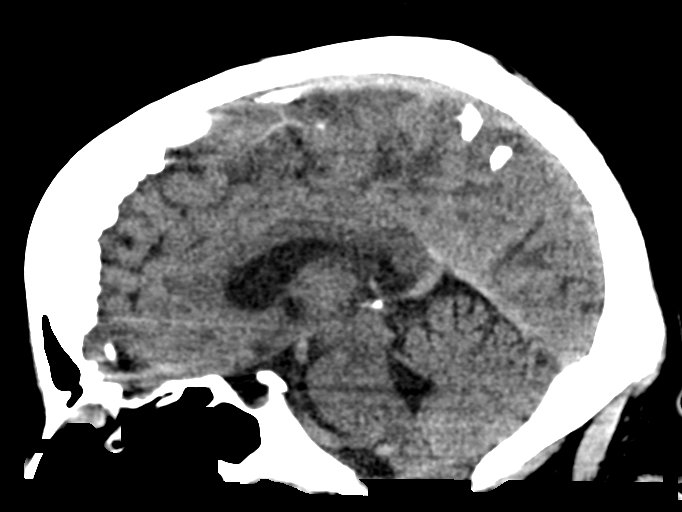
[im 37/56  brain]
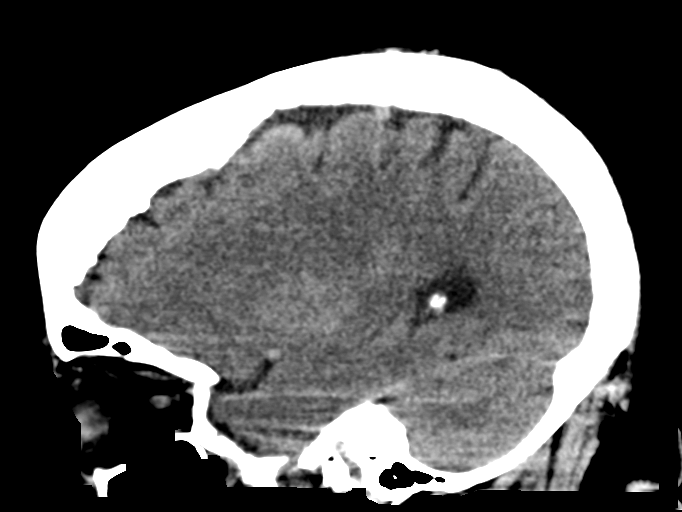

[16 of 47 positions shown; findings below may reference images not displayed]

FINDINGS: Brain: There is mild cerebral atrophy with widening of the
extra-axial spaces and ventricular dilatation.
There are areas of decreased attenuation within the white matter
tracts of the supratentorial brain, consistent with microvascular
disease changes.

Vascular: No hyperdense vessel or unexpected calcification.

Skull: Normal. Negative for fracture or focal lesion.

Sinuses/Orbits: No acute finding.

Other: None.
IMPRESSION: 1. Mild cerebral atrophy and microvascular disease changes of the
supratentorial brain.
2. No acute intracranial abnormality.

## 2022-04-30 NOTE — Progress Notes (Signed)
Cardiology Office Note  Date:  05/01/2022   ID:  Paula Goodman, Paula Goodman 1946/07/06, MRN [id number]  PCP:  Sallee Lange, NP   Chief Complaint  Patient presents with   12 month follow up     Patient c/o shortness of breath with over exertion. Medications reviewed by the patient verbally.     HPI:  Paula Goodman  is a pleasant 76 year old woman with  morbid obesity,   admission to the hospital September 2016 with atrial flutter, difficulty in controlling her rate who underwent TEE and cardioversion, converted back to atrial flutter one week later.  diabetes type 2,  hyperlipidemia,  Hypertension Sleep apnea but does not use CPAP, too expensive Previously referred to [facility] for possible ablation, recommendation was made to try antiarrhythmic medication first She presents for routine follow-up for her atrial fibrillation/flutter.  Last office visit with myself 2/22  In follow-up today she reports doing relatively well Walks with a cane, some gait instability Denies tachypalpitations concerning for arrhythmia Has remained on flecainide, metoprolol  Reports taking Lasix 40 twice daily Minimal leg swelling  Previously stopped anticoagulation on her own Did not want to restart No longer taking eliquis. CHADS VASC of 5  A1c continues to run high, 8.3 Reports she is not taking lisinopril on the clonidine  EKG personally reviewed by myself on todays visit Normal sinus rhythm rate 65 bpm nonspecific T wave abnormality  Other past medical history reviewed  EKG on today's visit shows normal sinus rhythm with rate 63 bpm,  No significant ST-T wave changes  Other past medical history Lasix increased to 40 mg daily on prior clinic visit, lab work checked at that time was essentially normal    PMH:   has a past medical history of AF (atrial fibrillation) ([place]), Arthritis, B12 deficiency, Cardiomyopathy ([place]), CHF (congestive heart failure) ([place]), Essential hypertension,  Fibrocystic breast disease, GERD (gastroesophageal reflux disease), History of tobacco abuse, HLD (hyperlipidemia), Morbid obesity ([place]), Obesity, Paroxysmal atrial flutter ([place]), and Type II diabetes mellitus ([place]).  PSH:    Past Surgical History:  Procedure Laterality Date   ABDOMINAL HYSTERECTOMY     BREAST BIOPSY Right    neg   CARDIOVERSION     COLONOSCOPY WITH PROPOFOL N/A 03/25/2018   Procedure: COLONOSCOPY WITH PROPOFOL;  Surgeon: Lollie Sails, MD;  Location: [facility] ENDOSCOPY;  Service: Endoscopy;  Laterality: N/A;   CYSTOSCOPY     ELECTROPHYSIOLOGIC STUDY N/A 04/26/2015   Procedure: CARDIOVERSION;  Surgeon: Minna Merritts, MD;  Location: [facility] ORS;  Service: Cardiovascular;  Laterality: N/A;   TEE WITHOUT CARDIOVERSION N/A 04/26/2015   Procedure: TRANSESOPHAGEAL ECHOCARDIOGRAM (TEE);  Surgeon: Minna Merritts, MD;  Location: [facility] ORS;  Service: Cardiovascular;  Laterality: N/A;    Current Outpatient Medications  Medication Sig Dispense Refill   albuterol (PROVENTIL HFA;VENTOLIN HFA) 108 (90 BASE) MCG/ACT inhaler Inhale 2 puffs into the lungs every 6 (six) hours as needed for wheezing or shortness of breath.     cloNIDine (CATAPRES) 0.1 MG tablet Take 1 tablet (0.1 mg total) by mouth 2 (two) times daily. 180 tablet 3   flecainide (TAMBOCOR) 100 MG tablet Take 1 tablet (100 mg total) by mouth 2 (two) times daily. 180 tablet 0   furosemide (LASIX) 40 MG tablet TAKE 1 TABLET BY MOUTH  TWICE DAILY; MAY TAKE 1  ADDITIONAL TABLET AS NEEDED FOR LEG SWELLING 270 tablet 1   gabapentin (NEURONTIN) 300 MG capsule Take 900 mg by mouth 2 (two) times daily.  glucose 4 GM chewable tablet Chew 8 g by mouth daily as needed. For low blood sugar.     insulin detemir (LEVEMIR FLEXTOUCH) 100 UNIT/ML FlexPen daily.     insulin glargine (LANTUS) 100 UNIT/ML injection Inject into the skin daily. Sliding scale     ketoconazole (NIZORAL) 2 % cream Apply 1 application topically 2 (two) times daily as  needed. For rash     lisinopril (ZESTRIL) 40 MG tablet TAKE 1 TABLET BY MOUTH  DAILY (Patient taking differently: Take 0.5 mg by mouth daily.) 90 tablet 3   lovastatin (MEVACOR) 40 MG tablet Take 40 mg by mouth every evening. With dinner.  1   metoprolol tartrate (LOPRESSOR) 25 MG tablet Take 1 tablet (25 mg total) by mouth 2 (two) times daily. 180 tablet 0   potassium chloride (K-DUR,KLOR-CON) 10 MEQ tablet Take 2 tablets (20 mEq total) by mouth 2 (two) times daily as needed. (Patient taking differently: Takes 2 tablets am and 1 tablet pm daily.) 180 tablet 3   colchicine 0.6 MG tablet Take 0.6 mg by mouth daily as needed. For gout flare up. (Patient not taking: Reported on 05/01/2022)     omeprazole (PRILOSEC) 20 MG capsule Take 20 mg by mouth daily. (Patient not taking: Reported on 05/01/2022)  1   No current facility-administered medications for this visit.     Allergies:   Patient has no known allergies.   Social History:  The patient  reports that she has been smoking cigarettes. She has a 10.00 pack-year smoking history. She has never used smokeless tobacco. She reports that she does not drink alcohol and does not use drugs.   Family History:   family history includes Breast cancer (age of onset: 53) in her maternal aunt; Diabetes in her mother; Hypertension in her mother.    Review of Systems: Review of Systems  Constitutional: Negative.   HENT: Negative.    Respiratory: Negative.    Cardiovascular: Negative.   Gastrointestinal: Negative.   Musculoskeletal: Negative.   Neurological: Negative.   Psychiatric/Behavioral: Negative.    All other systems reviewed and are negative.   PHYSICAL EXAM: VS:  BP 120/80 (BP Location: Left Arm, Patient Position: Sitting, Cuff Size: Large)   Pulse 65   Ht '5\' 7"'$  (1.702 m)   Wt 287 lb 8 oz (130.4 kg)   SpO2 98%   BMI 45.03 kg/m  , BMI Body mass index is 45.03 kg/m. Constitutional:  oriented to person, place, and time. No distress.  HENT:   Head: Grossly normal Eyes:  no discharge. No scleral icterus.  Neck: No JVD, no carotid bruits  Cardiovascular: Regular rate and rhythm, no murmurs appreciated Pulmonary/Chest: Clear to auscultation bilaterally, no wheezes or rails Abdominal: Soft.  no distension.  no tenderness.  Musculoskeletal: Normal range of motion Neurological:  normal muscle tone. Coordination normal. No atrophy Skin: Skin warm and dry Psychiatric: normal affect, pleasant  Recent Labs: 07/08/2021: ALT 18; BUN 9; Creatinine, Ser 0.95; Hemoglobin 15.7; Platelets 180; Potassium 3.4; Sodium 139    Lipid Panel No results found for: "CHOL", "HDL", "LDLCALC", "TRIG"    Wt Readings from Last 3 Encounters:  05/01/22 287 lb 8 oz (130.4 kg)  10/08/20 272 lb 6 oz (123.5 kg)  05/23/19 276 lb (125.2 kg)     ASSESSMENT AND PLAN:  Atrial fibrillation with RVR ([place]) /atrial flutter- Plan: EKG 12-Lead Maintaining normal sinus rhythm, continue flecainide and metoprolol Stopped anticoagulation in the past on her own CHADS VASC 5.  declining anticoagulation  Systolic and diastolic CHF, chronic (HCC) Recommend she continue Lasix 40 twice daily with potassium 20 twice daily Stable BMP Appears relatively euvolemic, minimal leg edema  Cardiomyopathy, unspecified type [facility]) echocardiogram September 2016 with ejection fraction 45-50% in the setting of atrial flutter Recommend she be started lisinopril 10 mg daily  Pure hypercholesterolemia  Continue lovastatin Lipid panel through primary care  Essential hypertension Continue clonidine, will add lisinopril 10 daily  Type 2 diabetes mellitus with complication, without long-term current use of insulin (HCC) Hemoglobin A1c typically poorly controlled Again we discussed diet with her, recommended regular walking program, avoiding high carbohydrate foods  Morbid obesity (HCC) A1c consistently greater than 10 On insulin Unable to exercise, low carbohydrate diet  recommended  No orders of the defined types were placed in this encounter.    Signed, Esmond Plants, M.D., Ph.D. 05/01/2022  [place], [place]

## 2022-05-01 ENCOUNTER — Encounter: Payer: Self-pay | Admitting: Cardiovascular Disease

## 2022-05-01 ENCOUNTER — Ambulatory Visit: Payer: Medicare Other | Attending: Cardiovascular Disease | Admitting: Cardiovascular Disease

## 2022-05-01 VITALS — BP 120/80 | HR 65 | Ht 67.0 in | Wt 287.5 lb

## 2022-05-01 DIAGNOSIS — E7800 Pure hypercholesterolemia, unspecified: Secondary | ICD-10-CM

## 2022-05-01 DIAGNOSIS — E118 Type 2 diabetes mellitus with unspecified complications: Secondary | ICD-10-CM

## 2022-05-01 DIAGNOSIS — I420 Dilated cardiomyopathy: Secondary | ICD-10-CM

## 2022-05-01 DIAGNOSIS — I5041 Acute combined systolic (congestive) and diastolic (congestive) heart failure: Secondary | ICD-10-CM

## 2022-05-01 DIAGNOSIS — I4891 Unspecified atrial fibrillation: Secondary | ICD-10-CM

## 2022-05-01 DIAGNOSIS — I4892 Unspecified atrial flutter: Secondary | ICD-10-CM

## 2022-05-01 DIAGNOSIS — I10 Essential (primary) hypertension: Secondary | ICD-10-CM

## 2022-05-01 MED ORDER — METOPROLOL TARTRATE 25 MG PO TABS: 25.0000 mg | ORAL_TABLET | Freq: Two times a day (BID) | ORAL | 3 refills | Status: DC

## 2022-05-01 MED ORDER — LISINOPRIL 10 MG PO TABS: 10.0000 mg | ORAL_TABLET | Freq: Every day | ORAL | 3 refills | Status: AC

## 2022-05-01 MED ORDER — FLECAINIDE ACETATE 100 MG PO TABS: 100.0000 mg | ORAL_TABLET | Freq: Two times a day (BID) | ORAL | 3 refills | Status: DC

## 2022-05-01 MED ORDER — FUROSEMIDE 40 MG PO TABS: ORAL_TABLET | ORAL | 2 refills | Status: DC

## 2022-05-01 NOTE — Patient Instructions (Addendum)
Medication Instructions:  Please change the lisinopril 40 mg down to 10 mg daily  If you need a refill on your cardiac medications before your next appointment, please call your pharmacy.   Lab work: No new labs needed  Testing/Procedures: No new testing needed  Follow-Up: At [facility], you and your health needs are our priority.  As part of our continuing mission to provide you with exceptional heart care, we have created designated Provider Care Teams.  These Care Teams include your primary Cardiologist (physician) and Advanced Practice Providers (APPs -  Physician Assistants and Nurse Practitioners) who all work together to provide you with the care you need, when you need it.  You will need a follow up appointment in 12 months  Providers on your designated Care Team:   Murray Hodgkins, NP Christell Faith, PA-C Cadence Kathlen M[place], [place]  COVID-19 Vaccine Information can be found at: ShippingScam.co.uk For questions related to vaccine distribution or appointments, please email [email or web address] or call [phone].

## 2022-05-23 ENCOUNTER — Ambulatory Visit
Admission: EM | Admit: 2022-05-23 | Discharge: 2022-05-23 | Disposition: A | Discharge status: Home / Self Care | Payer: Medicare Other | Attending: Emergency Medicine | Admitting: Emergency Medicine

## 2022-05-23 ENCOUNTER — Encounter: Payer: Self-pay | Admitting: Emergency Medicine

## 2022-05-23 ENCOUNTER — Ambulatory Visit (INDEPENDENT_AMBULATORY_CARE_PROVIDER_SITE_OTHER): Payer: Medicare Other

## 2022-05-23 DIAGNOSIS — M1711 Unilateral primary osteoarthritis, right knee: Secondary | ICD-10-CM

## 2022-05-23 MED ORDER — DICLOFENAC SODIUM 1 % EX GEL: 2.0000 g | Freq: Four times a day (QID) | CUTANEOUS | 0 refills | Status: AC | PRN

## 2022-05-23 NOTE — ED Triage Notes (Signed)
Pt c/o right knee pain. Started yesterday. No known injury. She states it just started hurting and made it hard for her to walk.

## 2022-05-23 NOTE — Discharge Instructions (Addendum)
You were seen for right knee pain and are being treated for right knee arthritis.   -Severe arthritis was noted in your x-ray.  No breaks or dislocation. -Use the prescribed topical gel as needed to your right knee. -Follow-up with your primary care provider soon as possible. -Monitor your symptoms to make sure that they improve.  If they do not get better with this topical treatment, or your swelling gets worse to your right leg, follow-up at your local emergency department for further evaluation.   Take care, Dr. Marland Kitchen, NP-c

## 2022-05-23 NOTE — ED Provider Notes (Signed)
[facility] - [place], [place]   Name: Paula Goodman DOB: 12/25/1945 MRN: [id number] CSN: [id number] PCP: Sallee Lange, NP  Arrival date and time:  05/23/22 1330  Chief Complaint:  Knee Pain (right)   NOTE: Prior to seeing the patient today, I have reviewed the triage nursing documentation and vital signs. Clinical staff has updated patient's PMH/PSHx, current medication list, and drug allergies/intolerances to ensure comprehensive history available to assist in medical decision making.   History:   HPI: Paula Goodman is a 76 y.o. female who presents today with complaints of acute right knee pain.  Patient states this started yesterday afternoon after her visit with her primary care provider.  The pain has progressively worsened overnight and throughout the day today.  She is unable to walk due to this pain.  She denies any trauma or or injury to this knee.  No previous history of injury to that extremity.  Of note, patient has a history of paroxysmal A-fib; has been off anticoagulation for years.  Current CHA2DS2-VASc score is 5 per cardiology's last note.  No long trips, though patient has some difficulty ambulating due to size.  She has not also noticed increased swelling to her right lower extremity.   Past Medical History:  Diagnosis Date   AF (atrial fibrillation) ([place])    Arthritis    B12 deficiency    Cardiomyopathy ([place])    a. Presumed to be tachy-mediated in setting of atrial flutter;  b. 03/2015 Echo: Ef45-50%, no rwma, mild MR, mildly dil LA/RV.   CHF (congestive heart failure) (HCC)    Essential hypertension    Fibrocystic breast disease    GERD (gastroesophageal reflux disease)    History of tobacco abuse    HLD (hyperlipidemia)    Morbid obesity (HCC)    Obesity    Paroxysmal atrial flutter ([place])    a. Dx 03/2015. CHA2DS2VASc = 5-->[place];  b. 04/2015 TEE/DCCV; c. 05/07/2015 recurrent AFlutter.   Type II diabetes mellitus ([place])     Past  Surgical History:  Procedure Laterality Date   ABDOMINAL HYSTERECTOMY     BREAST BIOPSY Right    neg   CARDIOVERSION     COLONOSCOPY WITH PROPOFOL N/A 03/25/2018   Procedure: COLONOSCOPY WITH PROPOFOL;  Surgeon: Lollie Sails, MD;  Location[facility] ENDOSCOPY;  Service: Endoscopy;  Laterality: N/A;   CYSTOSCOPY     ELECTROPHYSIOLOGIC STUDY N/A 04/26/2015   Procedure: CARDIOVERSION;  Surgeon: Minna Merritts, MD;  Location: [facility] ORS;  Service: Cardiovascular;  Laterality: N/A;   TEE WITHOUT CARDIOVERSION N/A 04/26/2015   Procedure: TRANSESOPHAGEAL ECHOCARDIOGRAM (TEE);  Surgeon: Minna Merritts, MD;  Location: [facility] ORS;  Service: Cardiovascular;  Laterality: N/A;    Family History  Problem Relation Age of Onset   Hypertension Mother    Diabetes Mother    Breast cancer Maternal Aunt 42    Social History   Tobacco Use   Smoking status: Every Day    Packs/day: 0.25    Years: 40.00    Total pack years: 10.00    Types: Cigarettes    Last attempt to quit: 11/09/2016    Years since quitting: 5.5   Smokeless tobacco: Never  Vaping Use   Vaping Use: Never used  Substance Use Topics   Alcohol use: No   Drug use: No    Patient Active Problem List   Diagnosis Date Noted   Atrial fibrillation ([place]) 05/20/2015   Cardiomyopathy ([place])  Paroxysmal atrial flutter (HCC)    HLD (hyperlipidemia)    Morbid obesity (HCC)    Essential hypertension    Type II diabetes mellitus (HCC)    Atrial flutter, unspecified    Dyspnea    Swelling    Systolic and diastolic CHF, acute ([place])    Atrial fibrillation with RVR ([place]) 04/21/2015    Home Medications:    Current Meds  Medication Sig   albuterol (PROVENTIL HFA;VENTOLIN HFA) 108 (90 BASE) MCG/ACT inhaler Inhale 2 puffs into the lungs every 6 (six) hours as needed for wheezing or shortness of breath.   cloNIDine (CATAPRES) 0.1 MG tablet Take 1 tablet (0.1 mg total) by mouth 2 (two) times daily.   colchicine 0.6 MG tablet Take 0.6 mg by  mouth daily as needed. For gout flare up.   flecainide (TAMBOCOR) 100 MG tablet Take 1 tablet (100 mg total) by mouth 2 (two) times daily.   furosemide (LASIX) 40 MG tablet TAKE 1 TABLET BY MOUTH  TWICE DAILY; MAY TAKE 1  ADDITIONAL TABLET AS NEEDED FOR LEG SWELLING   gabapentin (NEURONTIN) 300 MG capsule Take 900 mg by mouth 2 (two) times daily.   insulin detemir (LEVEMIR FLEXTOUCH) 100 UNIT/ML FlexPen daily.   insulin glargine (LANTUS) 100 UNIT/ML injection Inject into the skin daily. Sliding scale   lisinopril (ZESTRIL) 10 MG tablet Take 1 tablet (10 mg total) by mouth daily.   metoprolol tartrate (LOPRESSOR) 25 MG tablet Take 1 tablet (25 mg total) by mouth 2 (two) times daily.   omeprazole (PRILOSEC) 20 MG capsule Take 20 mg by mouth daily.   potassium chloride (K-DUR,KLOR-CON) 10 MEQ tablet Take 2 tablets (20 mEq total) by mouth 2 (two) times daily as needed. (Patient taking differently: Takes 2 tablets am and 1 tablet pm daily.)   rosuvastatin (CRESTOR) 10 MG tablet Take 1 tablet (10 mg total) by mouth daily.    Allergies:   Patient has no known allergies.  Review of Systems (ROS): Review of Systems  Constitutional:  Positive for activity change. Negative for appetite change, fatigue and fever.  Respiratory:  Negative for cough, chest tightness and shortness of breath.   Musculoskeletal:  Positive for gait problem, joint swelling and myalgias.     Vital Signs: Today's Vitals   05/23/22 1347 05/23/22 1348 05/23/22 1350  BP:   (!) 162/83  Pulse:   63  Resp:   18  Temp:   97.9 F (36.6 C)  TempSrc:   Oral  SpO2:   95%  Weight:  287 lb 7.7 oz (130.4 kg)   Height:  '5\' 7"'$  (1.702 m)   PainSc: 7       Physical Exam: Physical Exam Vitals and nursing note reviewed.  Constitutional:      Appearance: Normal appearance.  Cardiovascular:     Rate and Rhythm: Normal rate and regular rhythm.     Pulses: Normal pulses.     Heart sounds: Normal heart sounds.  Pulmonary:      Effort: Pulmonary effort is normal.     Breath sounds: Normal breath sounds.  Musculoskeletal:     Right knee: Swelling present. No erythema or ecchymosis. Decreased range of motion. Tenderness present over the lateral joint line. Normal pulse.     Left knee: Swelling present.     Comments: Bilateral lower extremity edema though right lower extremities greater than left lower extremity  Skin:    General: Skin is warm and dry.  Neurological:     General:  No focal deficit present.     Mental Status: She is alert and oriented to person, place, and time.  Psychiatric:        Mood and Affect: Mood normal.        Behavior: Behavior normal.      Urgent Care Treatments / Results:   LABS: PLEASE NOTE: all labs that were ordered this encounter are listed, however only abnormal results are displayed. Labs Reviewed - No data to display  EKG: -None  RADIOLOGY: DG Knee Complete 4 Views Right  Result Date: 05/23/2022 CLINICAL DATA:  Acute right knee pain.  No known injury. EXAM: RIGHT KNEE - COMPLETE 4+ VIEW COMPARISON:  None Available. FINDINGS: There is diffuse decreased bone mineralization. Mild-to-moderate medial compartment joint space narrowing. Mild peripheral mediolateral compartment degenerative osteophytosis. Moderate to severe patellofemoral joint space narrowing and moderate predominantly superior patellar degenerative osteophytosis. Mild chronic enthesopathic spurring at the quadriceps and patellar insertions on the patella. Tiny joint effusion. No acute fracture is seen. No dislocation. IMPRESSION: Mild-to-moderate medial compartment and moderate to severe patellofemoral compartment osteoarthritis. Electronically Signed   By: Yvonne Kendall M.D.   On: 05/23/2022 14:59    PROCEDURES: Procedures  MEDICATIONS RECEIVED THIS VISIT: Medications - No data to display  PERTINENT CLINICAL COURSE NOTES/UPDATES:   Initial Impression / Assessment and Plan / Urgent Care Course:  Pertinent  labs & imaging results that were available during my care of the patient were personally reviewed by me and considered in my medical decision making (see lab/imaging section of note for values and interpretations).  Paula Goodman is a 76 y.o. female who presents to [facility] today with complaints of right knee pain, diagnosed with arthritis of the right knee, and treated as such with the medications below. NP and patient reviewed discharge instructions below during visit.  Discussed the additional differential of a DVT.  Patient deferred ED evaluation at this time though it was strongly recommended.  Strict hospital precautions reviewed with patient.  Patient verbalized understanding and agreed.  Patient is well appearing overall in clinic today. She does not appear to be in any acute distress. Presenting symptoms (see HPI) and exam as documented above.   I have reviewed the follow up and strict return precautions for any new or worsening symptoms. Patient is aware of symptoms that would be deemed urgent/emergent, and would thus require further evaluation either here or in the emergency department. At the time of discharge, she verbalized understanding and consent with the discharge plan as it was reviewed with her. All questions were fielded by provider and/or clinic staff prior to patient discharge.    Final Clinical Impressions / Urgent Care Diagnoses:   Final diagnoses:  Arthritis of right knee    New Prescriptions:  [place] Controlled Substance Registry consulted? Not Applicable  Meds ordered this encounter  Medications   diclofenac Sodium (VOLTAREN) 1 % GEL    Sig: Apply 2 g topically 4 (four) times daily as needed.    Dispense:  50 g    Refill:  0      Discharge Instructions      You were seen for right knee pain and are being treated for right knee arthritis.   -Severe arthritis was noted in your x-ray.  No breaks or dislocation. -Use the prescribed topical gel as  needed to your right knee. -Follow-up with your primary care provider soon as possible. -Monitor your symptoms to make sure that they improve.  If they do  not get better with this topical treatment, or your swelling gets worse to your right leg, follow-up at your local emergency department for further evaluation.   Take care, Dr. Marland Kitchen, NP-c      Recommended Follow up Care:  Patient encouraged to follow up with the following provider within the specified time frame, or sooner as dictated by the severity of her symptoms. As always, she was instructed that for any urgent/emergent care needs, she should seek care either here or in the emergency department for more immediate evaluation.   Gertie Baron, DNP, NP-c   Gertie Baron, NP 05/23/22 [id number]

## 2022-09-01 ENCOUNTER — Other Ambulatory Visit: Payer: Self-pay | Admitting: Cardiovascular Disease

## 2022-09-27 ENCOUNTER — Other Ambulatory Visit: Payer: Self-pay | Admitting: Cardiovascular Disease

## 2022-11-10 ENCOUNTER — Other Ambulatory Visit: Payer: Self-pay | Admitting: Cardiovascular Disease

## 2022-12-28 ENCOUNTER — Other Ambulatory Visit: Payer: Self-pay | Admitting: Nurse Practitioner

## 2022-12-28 DIAGNOSIS — Z1231 Encounter for screening mammogram for malignant neoplasm of breast: Secondary | ICD-10-CM

## 2023-01-21 ENCOUNTER — Ambulatory Visit
Admission: RE | Admit: 2023-01-21 | Discharge: 2023-01-21 | Disposition: A | Discharge status: Home / Self Care | Payer: Medicare Other | Source: Ambulatory Visit | Attending: Nurse Practitioner | Admitting: Nurse Practitioner

## 2023-01-21 DIAGNOSIS — Z1231 Encounter for screening mammogram for malignant neoplasm of breast: Secondary | ICD-10-CM | POA: Diagnosis not present

## 2023-01-26 ENCOUNTER — Other Ambulatory Visit: Payer: Self-pay | Admitting: Cardiovascular Disease

## 2023-03-11 ENCOUNTER — Other Ambulatory Visit: Payer: Self-pay | Admitting: Cardiovascular Disease

## 2023-04-04 ENCOUNTER — Other Ambulatory Visit: Payer: Self-pay | Admitting: Cardiovascular Disease

## 2023-04-05 ENCOUNTER — Telehealth: Payer: Self-pay | Admitting: Cardiovascular Disease

## 2023-04-05 NOTE — Telephone Encounter (Signed)
Left voice mail, needs to schedule appt from recall.

## 2023-04-05 NOTE — Telephone Encounter (Signed)
Good Morning,   Could you please schedule this patient a 12 month follow up visit? The patient was last seen by Dr. Mariah Milling on 05-01-2022. Thank you so much.

## 2023-04-05 NOTE — Telephone Encounter (Signed)
Left voice mail to schedule appt

## 2023-04-08 NOTE — Telephone Encounter (Signed)
Pt is scheduled on 10/18

## 2023-04-12 ENCOUNTER — Other Ambulatory Visit: Payer: Self-pay | Admitting: Cardiovascular Disease

## 2023-04-28 ENCOUNTER — Other Ambulatory Visit: Payer: Self-pay | Admitting: Cardiovascular Disease

## 2023-05-10 ENCOUNTER — Other Ambulatory Visit: Payer: Self-pay | Admitting: Cardiovascular Disease

## 2023-05-30 ENCOUNTER — Other Ambulatory Visit: Payer: Self-pay | Admitting: Cardiovascular Disease

## 2023-06-11 ENCOUNTER — Encounter: Payer: Self-pay | Admitting: Cardiovascular Disease

## 2023-06-11 ENCOUNTER — Ambulatory Visit: Payer: Medicare Other | Attending: Cardiovascular Disease | Admitting: Cardiovascular Disease

## 2023-06-11 VITALS — BP 120/60 | HR 70 | Ht 68.0 in | Wt 283.1 lb

## 2023-06-11 DIAGNOSIS — I4891 Unspecified atrial fibrillation: Secondary | ICD-10-CM | POA: Diagnosis not present

## 2023-06-11 DIAGNOSIS — I5041 Acute combined systolic (congestive) and diastolic (congestive) heart failure: Secondary | ICD-10-CM

## 2023-06-11 DIAGNOSIS — I4892 Unspecified atrial flutter: Secondary | ICD-10-CM

## 2023-06-11 DIAGNOSIS — I10 Essential (primary) hypertension: Secondary | ICD-10-CM | POA: Diagnosis not present

## 2023-06-11 MED ORDER — TORSEMIDE 20 MG PO TABS: ORAL_TABLET | ORAL | 3 refills | Status: AC

## 2023-06-11 NOTE — Progress Notes (Signed)
Cardiology Office Note  Date:  06/11/2023   ID:  Serinity, Star [birth year], MRN [id number]  PCP:  Myrene Buddy, NP   Chief Complaint  Patient presents with   1 year follow up     "Doing well." Medications reviewed by the patient verbally.     HPI:  Ms. Holligan  is a pleasant 77 year old woman with  morbid obesity,   admission to the hospital September 2016 with atrial flutter, difficulty in controlling her rate who underwent TEE and cardioversion, converted back to atrial flutter one week later.  diabetes type 2,  hyperlipidemia,  Hypertension Sleep apnea but does not use CPAP, too expensive Previously referred to [facility] for possible ablation, recommendation was made to try antiarrhythmic medication first She presents for routine follow-up for her atrial fibrillation/flutter.  Last office visit with myself 9/23  In follow-up today reports that she is doing well  Last week seen by primary care, reported worsening leg swelling, and shortness of breath started on prednisone Weight was running higher, she was started on torsemide 20/10, Lasix held  Still with leg swelling, "much better" Weight tracking daily 283 to 278,  Feels her breathing is better  [facility] with a cane Denies tachypalpitations concerning for atrial fibrillation  remains on flecainide, metoprolol  Previously stopped anticoagulation on her own Did not want to restart No longer taking eliquis. CHADS VASC of 5  A1c continues to run high, 7.1 Remains on lisinopril and clonidine twice daily  EKG personally reviewed by myself on todays visit EKG Interpretation Date/Time:  Friday June 11 2023 16:23:08 EDT Ventricular Rate:  70 PR Interval:  160 QRS Duration:  98 QT Interval:  408 QTC Calculation: 440 R Axis:   -25  Text Interpretation: Normal sinus rhythm Normal ECG When compared with ECG of 08-Jul-2021 17:33, No significant change was found Confirmed by Julien Nordmann [id number]) on  06/11/2023 4:27:28 PM   Other past medical history Lasix increased to 40 mg daily on prior clinic visit, lab work checked at that time was essentially normal    PMH:   has a past medical history of AF (atrial fibrillation) (HCC), Arthritis, B12 deficiency, Cardiomyopathy (HCC), CHF (congestive heart failure) (HCC), Essential hypertension, Fibrocystic breast disease, GERD (gastroesophageal reflux disease), History of tobacco abuse, HLD (hyperlipidemia), Morbid obesity (HCC), Obesity, Paroxysmal atrial flutter (HCC), and Type II diabetes mellitus (HCC).  PSH:    Past Surgical History:  Procedure Laterality Date   ABDOMINAL HYSTERECTOMY     BREAST BIOPSY Right    neg   CARDIOVERSION     COLONOSCOPY WITH PROPOFOL N/A 03/25/2018   Procedure: COLONOSCOPY WITH PROPOFOL;  Surgeon: Christena Deem, MD;  Location: [facility] ENDOSCOPY;  Service: Endoscopy;  Laterality: N/A;   CYSTOSCOPY     ELECTROPHYSIOLOGIC STUDY N/A 04/26/2015   Procedure: CARDIOVERSION;  Surgeon: Antonieta Iba, MD;  Location: [facility] ORS;  Service: Cardiovascular;  Laterality: N/A;   TEE WITHOUT CARDIOVERSION N/A 04/26/2015   Procedure: TRANSESOPHAGEAL ECHOCARDIOGRAM (TEE);  Surgeon: Antonieta Iba, MD;  Location: [facility] ORS;  Service: Cardiovascular;  Laterality: N/A;    Current Outpatient Medications  Medication Sig Dispense Refill   albuterol (PROVENTIL HFA;VENTOLIN HFA) 108 (90 BASE) MCG/ACT inhaler Inhale 2 puffs into the lungs every 6 (six) hours as needed for wheezing or shortness of breath.     cloNIDine (CATAPRES) 0.1 MG tablet TAKE 1 TABLET BY MOUTH TWICE  DAILY 120 tablet 5   colchicine 0.6 MG tablet Take 0.6 mg by mouth  daily as needed. For gout flare up.     diclofenac Sodium (VOLTAREN) 1 % GEL Apply 2 g topically 4 (four) times daily as needed. 50 g 0   flecainide (TAMBOCOR) 100 MG tablet TAKE 1 TABLET BY MOUTH TWICE  DAILY 180 tablet 0   gabapentin (NEURONTIN) 300 MG capsule Take 900 mg by mouth 2 (two) times daily.      glucose 4 GM chewable tablet Chew 8 g by mouth daily as needed. For low blood sugar.     insulin glargine (LANTUS) 100 UNIT/ML injection Inject 20 Units into the skin every evening. Sliding scale     insulin lispro (HUMALOG) 100 UNIT/ML KwikPen INJECT 30 UNITS INTO SKIN BEFORE BREAKFAST, 15 UNITS BEFORE LUNCH& 28 UNITS BEFORE SUPPER. HOLD IF NOT EATING. USE 1/2 DOSE IF LIGHTER MEAL     ketoconazole (NIZORAL) 2 % cream Apply 1 application topically 2 (two) times daily as needed. For rash     lisinopril (ZESTRIL) 10 MG tablet Take 1 tablet (10 mg total) by mouth daily. 90 tablet 3   metoprolol tartrate (LOPRESSOR) 25 MG tablet TAKE 1 TABLET BY MOUTH TWICE  DAILY 180 tablet 0   omeprazole (PRILOSEC) 20 MG capsule Take 20 mg by mouth daily.  1   potassium chloride (KLOR-CON) 10 MEQ tablet Take 20 meq in the am & 10 meq in the pm     rosuvastatin (CRESTOR) 10 MG tablet Take 1 tablet (10 mg total) by mouth daily. 90 tablet 3   torsemide (DEMADEX) 20 MG tablet Take 20 mg tablet in the am & 10 mg tablet in the pm     No current facility-administered medications for this visit.     Allergies:   Patient has no known allergies.   Social History:  The patient  reports that she has been smoking cigarettes. She started smoking about 46 years ago. She has a 10 pack-year smoking history. She has never used smokeless tobacco. She reports that she does not drink alcohol and does not use drugs.   Family History:   family history includes Breast cancer (age of onset: 32) in her maternal aunt; Diabetes in her mother; Hypertension in her mother.    Review of Systems: Review of Systems  Constitutional: Negative.   HENT: Negative.    Respiratory: Negative.    Cardiovascular: Negative.   Gastrointestinal: Negative.   Musculoskeletal: Negative.   Neurological: Negative.   Psychiatric/Behavioral: Negative.    All other systems reviewed and are negative.   PHYSICAL EXAM: VS:  BP 120/60 (BP Location: Left  Arm, Patient Position: Sitting, Cuff Size: Large)   Pulse 70   Ht 5\' 8"  (1.727 m)   Wt 283 lb 2 oz (128.4 kg)   SpO2 95%   BMI 43.05 kg/m  , BMI Body mass index is 43.05 kg/m. Constitutional:  oriented to person, place, and time. No distress.  HENT:  Head: Grossly normal Eyes:  no discharge. No scleral icterus.  Neck: No JVD, no carotid bruits  Cardiovascular: Regular rate and rhythm, no murmurs appreciated Pulmonary/Chest: Clear to auscultation bilaterally, no wheezes or rails Abdominal: Soft.  no distension.  no tenderness.  Musculoskeletal: Normal range of motion Neurological:  normal muscle tone. Coordination normal. No atrophy Skin: Skin warm and dry Psychiatric: normal affect, pleasant  Recent Labs: No results found for requested labs within last 365 days.    Lipid Panel No results found for: "CHOL", "HDL", "LDLCALC", "TRIG"    Wt Readings from Last 3  Encounters:  06/11/23 283 lb 2 oz (128.4 kg)  05/23/22 287 lb 7.7 oz (130.4 kg)  05/01/22 287 lb 8 oz (130.4 kg)     ASSESSMENT AND PLAN:  Atrial fibrillation with RVR (HCC) /atrial flutter-  Maintaining normal sinus rhythm, continue flecainide and metoprolol Stopped anticoagulation in the past on her own CHADS VASC 5.  declining anticoagulation on prior office visits  Systolic and diastolic CHF, chronic (HCC) Lasix changed to torsemide 20 in the morning and 10 in the afternoon 1 week ago Improved lower extremity edema, weight down 5 pounds she reports, down to 278 at home Recommend she continue on the above regiment, would hold the afternoon Lasix for weight less than 275 For weight less than 273 recommend she go to torsemide every other day If weight starts to trend back upwards, she will need to reinitiate the torsemide  Cardiomyopathy, unspecified type [facility]) echocardiogram September 2016 with ejection fraction 45-50% in the setting of atrial flutter, continue lisinopril 10 mg daily For continued worsening  fluid retention may need to update echo  Pure hypercholesterolemia  Continue lovastatin Lipid panel through primary care  Essential hypertension Continue clonidine,  lisinopril 10 daily  Type 2 diabetes mellitus with complication, without long-term current use of insulin (HCC) Hemoglobin A1c improving Low carbohydrate diet recommended  Morbid obesity (HCC) A1c 7.1 On insulin Unable to exercise, low carbohydrate diet recommended  Orders Placed This Encounter  Procedures   EKG 12-Lead     Signed, Dossie Arbour, M.D., Ph.D. 06/11/2023  [facility] [place], [place] [phone]

## 2023-06-11 NOTE — Patient Instructions (Addendum)
Medication Instructions:  Continue torsemide 20 in AM and 10 in PM until leg swelling better Then decrease torsemide down to 20 mg daily at weight 275 LB   If weight drop too much, less then 273 LB Take torsemide 20 mg every other day  If you need a refill on your cardiac medications before your next appointment, please call your pharmacy.   Lab work: No new labs needed  Testing/Procedures: No new testing needed  Follow-Up: At [facility], you and your health needs are our priority.  As part of our continuing mission to provide you with exceptional heart care, we have created designated Provider Care Teams.  These Care Teams include your primary Cardiologist (physician) and Advanced Practice Providers (APPs -  Physician Assistants and Nurse Practitioners) who all work together to provide you with the care you need, when you need it.  You will need a follow up appointment in 6 months  Providers on your designated Care Team:   Nicolasa Ducking, NP Eula Listen, PA-C Cadence Fransico [place], [place]  COVID-19 Vaccine Information can be found at: [email or web address] For questions related to vaccine distribution or appointments, please email [email or web address] or call [phone].

## 2023-07-12 ENCOUNTER — Other Ambulatory Visit: Payer: Self-pay | Admitting: Cardiovascular Disease

## 2023-07-19 ENCOUNTER — Other Ambulatory Visit: Payer: Self-pay | Admitting: Cardiovascular Disease

## 2023-09-26 ENCOUNTER — Other Ambulatory Visit: Payer: Self-pay | Admitting: Cardiovascular Disease

## 2023-09-27 NOTE — Telephone Encounter (Signed)
Last office visit:  06/11/23  with plan to f/u in 6 months  Next office visit:  none/active recall

## 2023-12-22 ENCOUNTER — Other Ambulatory Visit: Payer: Self-pay | Admitting: Cardiovascular Disease

## 2024-02-27 ENCOUNTER — Other Ambulatory Visit: Payer: Self-pay | Admitting: Cardiovascular Disease

## 2024-03-05 ENCOUNTER — Other Ambulatory Visit: Payer: Self-pay | Admitting: Cardiovascular Disease

## 2024-03-12 ENCOUNTER — Other Ambulatory Visit: Payer: Self-pay | Admitting: Cardiovascular Disease

## 2024-04-21 ENCOUNTER — Other Ambulatory Visit: Payer: Self-pay | Admitting: Emergency Medicine

## 2024-04-21 DIAGNOSIS — R0602 Shortness of breath: Secondary | ICD-10-CM

## 2024-04-21 DIAGNOSIS — J441 Chronic obstructive pulmonary disease with (acute) exacerbation: Secondary | ICD-10-CM

## 2024-04-21 DIAGNOSIS — F17210 Nicotine dependence, cigarettes, uncomplicated: Secondary | ICD-10-CM

## 2024-04-21 DIAGNOSIS — I4891 Unspecified atrial fibrillation: Secondary | ICD-10-CM

## 2024-04-27 ENCOUNTER — Ambulatory Visit
Admission: RE | Admit: 2024-04-27 | Discharge: 2024-04-27 | Disposition: A | Discharge status: Home / Self Care | Source: Ambulatory Visit | Attending: Emergency Medicine | Admitting: Emergency Medicine

## 2024-04-27 DIAGNOSIS — J441 Chronic obstructive pulmonary disease with (acute) exacerbation: Secondary | ICD-10-CM | POA: Insufficient documentation

## 2024-04-27 DIAGNOSIS — R0602 Shortness of breath: Secondary | ICD-10-CM | POA: Diagnosis present

## 2024-04-27 DIAGNOSIS — F17210 Nicotine dependence, cigarettes, uncomplicated: Secondary | ICD-10-CM | POA: Insufficient documentation

## 2024-04-27 DIAGNOSIS — I4891 Unspecified atrial fibrillation: Secondary | ICD-10-CM | POA: Diagnosis present

## 2024-04-27 DIAGNOSIS — I4892 Unspecified atrial flutter: Secondary | ICD-10-CM | POA: Insufficient documentation

## 2024-05-02 ENCOUNTER — Other Ambulatory Visit: Payer: Self-pay | Admitting: Cardiovascular Disease

## 2024-06-05 ENCOUNTER — Other Ambulatory Visit: Payer: Self-pay | Admitting: Cardiovascular Disease

## 2024-06-24 DEATH — deceased
